# Patient Record
Sex: Female | Born: 1985 | Race: Black or African American | Hispanic: No | Marital: Single | Smoking: Former smoker
Health system: Southern US, Community
[De-identification: ages and names within clinical notes are randomized; demographics above are authoritative.]

## PROBLEM LIST (undated history)

## (undated) DIAGNOSIS — K5790 Diverticulosis of intestine, part unspecified, without perforation or abscess without bleeding: Secondary | ICD-10-CM

## (undated) DIAGNOSIS — K589 Irritable bowel syndrome without diarrhea: Secondary | ICD-10-CM

---

## 2009-12-24 HISTORY — PX: THYROIDECTOMY, PARTIAL: SHX18

## 2015-05-09 ENCOUNTER — Ambulatory Visit: Payer: Medicaid Other | Attending: Internal Medicine | Admitting: Internal Medicine

## 2015-05-09 ENCOUNTER — Encounter: Payer: Self-pay | Admitting: Internal Medicine

## 2015-05-09 VITALS — BP 107/72 | HR 86 | Temp 98.7°F | Ht 62.0 in | Wt 103.0 lb

## 2015-05-09 DIAGNOSIS — Z0000 Encounter for general adult medical examination without abnormal findings: Secondary | ICD-10-CM | POA: Diagnosis not present

## 2015-05-09 DIAGNOSIS — H538 Other visual disturbances: Secondary | ICD-10-CM

## 2015-05-09 DIAGNOSIS — G43909 Migraine, unspecified, not intractable, without status migrainosus: Secondary | ICD-10-CM | POA: Diagnosis not present

## 2015-05-09 MED ORDER — CYCLOBENZAPRINE HCL 5 MG PO TABS: 5.0000 mg | ORAL_TABLET | Freq: Three times a day (TID) | ORAL | Status: DC | PRN

## 2015-05-09 NOTE — Patient Instructions (Addendum)
I have sent referrals to Gynecology and Optometry (eye exam)  Please keep a log of all headaches s

## 2015-05-09 NOTE — Progress Notes (Signed)
Patient ID: Wendy Greene Dust, female   DOB: 1986-07-31, 29 y.o.   MRN: [id number] SUBJECTIVE: Wendy Greene Rando is a 29 y.o. female who complains of headaches for 1 month(s). Description of pain: squeezing pain in frontal region. Duration of individual headaches: few hour(s), frequency weekly, couple of times per week. Associated symptoms: dizziness, nausea and vomiting. Pain relief: acetaminophen, OTC NSAID's, lying in a darkened room and sleep. Precipitating factors: patient is aware of none. She denies a history of recent head injury.  Prior neurological history: negative for no neurological problems. Neurologic Review of Systems - no TIA or stroke-like symptoms.   OBJECTIVE: Appearance: alert, well appearing, and in no distress, oriented to person, place, and time and normal appearing weight. Neurological Exam: alert, oriented, normal speech, no focal findings or movement disorder noted, neck supple without rigidity, cranial nerves II through XII intact, Romberg sign negative, normal gait and station.   Judeth CornfieldStephanie was seen today for establish care.  Diagnoses and all orders for this visit:  Migraine without status migrainosus, not intractable, unspecified migraine type Orders: -    Begin cyclobenzaprine (FLEXERIL) 5 MG tablet; Take 1 tablet (5 mg total) by mouth 3 (three) times daily as needed. Headaches Patient will complete a headache diary for next visit. She has not been taking anything for pain and reports that the headaches are not severe. Will try flexeril first and have patient come back for a f/u  Blurred vision Orders: -     Ambulatory referral to Optometry  Preventative health care Orders: -     Ambulatory referral to Gynecology   PLAN: Recommendations: lie in darkened room and apply cold packs prn for pain, side effect profile discussed in detail, asked to keep headache diary and patient reassured that neurodiagnostic workup not indicated from benign H & P. See orders for  this visit as documented in the electronic medical record.  Return if symptoms worsen or fail to improve.  Holland CommonsKECK, Alitzel Cookson, NP 05/23/2015 12:15 PM

## 2015-05-09 NOTE — Progress Notes (Signed)
Patient is here to establish care She is complaining of headaches associated with nausea and vomiting She also complains of blurry vision separate from the headaches She complains of trouble seeing while driving at night She is due for a pap

## 2015-05-10 ENCOUNTER — Encounter: Payer: Self-pay | Admitting: Obstetrics & Gynecology

## 2015-05-23 ENCOUNTER — Encounter: Payer: Self-pay | Admitting: Internal Medicine

## 2015-06-01 ENCOUNTER — Other Ambulatory Visit (HOSPITAL_COMMUNITY)
Admission: RE | Admit: 2015-06-01 | Discharge: 2015-06-01 | Disposition: A | Discharge status: Home / Self Care | Payer: Medicaid Other | Source: Ambulatory Visit | Attending: Obstetrics & Gynecology | Admitting: Obstetrics & Gynecology

## 2015-06-01 ENCOUNTER — Encounter: Payer: Self-pay | Admitting: Obstetrics & Gynecology

## 2015-06-01 ENCOUNTER — Ambulatory Visit (INDEPENDENT_AMBULATORY_CARE_PROVIDER_SITE_OTHER): Payer: Medicaid Other | Admitting: Obstetrics & Gynecology

## 2015-06-01 VITALS — BP 115/72 | HR 90 | Temp 98.6°F | Ht 62.0 in | Wt 105.9 lb

## 2015-06-01 DIAGNOSIS — N649 Disorder of breast, unspecified: Secondary | ICD-10-CM | POA: Diagnosis not present

## 2015-06-01 DIAGNOSIS — Z01419 Encounter for gynecological examination (general) (routine) without abnormal findings: Secondary | ICD-10-CM

## 2015-06-01 DIAGNOSIS — Z124 Encounter for screening for malignant neoplasm of cervix: Secondary | ICD-10-CM

## 2015-06-01 DIAGNOSIS — Z1151 Encounter for screening for human papillomavirus (HPV): Secondary | ICD-10-CM | POA: Diagnosis not present

## 2015-06-01 NOTE — Patient Instructions (Addendum)
Thank you for enrolling in [facility]. Please follow the instructions below to securely access your online medical record. MyChart allows you to send messages to your doctor, view your test results, manage appointments, and more.   How Do I Sign Up? 1. In your Internet browser, go to [employer] and enter [email or web address]. 2. Click on the Sign Up Now link in the Sign In box. You will see the New Member Sign Up page. 3. Enter your MyChart Access Code exactly as it appears below. You will not need to use this code after you've completed the sign-up process. If you do not sign up before the expiration date, you must request a new code.  MyChart Access Code: [email or web address] Expires: 07/08/2015  2:49 PM  4. Enter your Social Security Number ([email or web address]) and Date of Birth (mm/dd/yyyy) as indicated and click Submit. You will be taken to the next sign-up page. 5. Create a MyChart ID. This will be your MyChart login ID and cannot be changed, so think of one that is secure and easy to remember. 6. Create a MyChart password. You can change your password at any time. 7. Enter your Password Reset Question and Answer. This can be used at a later time if you forget your password.  8. Enter your e-mail address. You will receive e-mail notification when new information is available in [place]. 9. Click Sign Up. You can now view your medical record.   Additional Information Remember, MyChart is NOT to be used for urgent needs. For medical emergencies, dial 911.     Preventive Care for Adults A healthy lifestyle and preventive care can promote health and wellness. Preventive health guidelines for women include the following key practices.  A routine yearly physical is a good way to check with your health care provider about your health and preventive screening. It is a chance to share any concerns and updates on your health and to receive a thorough exam.  Visit your dentist for a routine  exam and preventive care every 6 months. Brush your teeth twice a day and floss once a day. Good oral hygiene prevents tooth decay and gum disease.  The frequency of eye exams is based on your age, health, family medical history, use of contact lenses, and other factors. Follow your health care provider's recommendations for frequency of eye exams.  Eat a healthy diet. Foods like vegetables, fruits, whole grains, low-fat dairy products, and lean protein foods contain the nutrients you need without too many calories. Decrease your intake of foods high in solid fats, added sugars, and salt. Eat the right amount of calories for you.Get information about a proper diet from your health care provider, if necessary.  Regular physical exercise is one of the most important things you can do for your health. Most adults should get at least 150 minutes of moderate-intensity exercise (any activity that increases your heart rate and causes you to sweat) each week. In addition, most adults need muscle-strengthening exercises on 2 or more days a week.  Maintain a healthy weight. The body mass index (BMI) is a screening tool to identify possible weight problems. It provides an estimate of body fat based on height and weight. Your health care provider can find your BMI and can help you achieve or maintain a healthy weight.For adults 20 years and older:  A BMI below 18.5 is considered underweight.  A BMI of 18.5 to 24.9 is normal.  A BMI of 25 to 29.9 is considered overweight.  A BMI of 30 and above is considered obese.  Maintain normal blood lipids and cholesterol levels by exercising and minimizing your intake of saturated fat. Eat a balanced diet with plenty of fruit and vegetables. Blood tests for lipids and cholesterol should begin at age 40 and be repeated every 5 years. If your lipid or cholesterol levels are high, you are over 50, or you are at high risk for heart disease, you may need your cholesterol  levels checked more frequently.Ongoing high lipid and cholesterol levels should be treated with medicines if diet and exercise are not working.  If you smoke, find out from your health care provider how to quit. If you do not use tobacco, do not start.  Lung cancer screening is recommended for adults aged 85-80 years who are at high risk for developing lung cancer because of a history of smoking. A yearly low-dose CT scan of the lungs is recommended for people who have at least a 30-pack-year history of smoking and are a current smoker or have quit within the past 15 years. A pack year of smoking is smoking an average of 1 pack of cigarettes a day for 1 year (for example: 1 pack a day for 30 years or 2 packs a day for 15 years). Yearly screening should continue until the smoker has stopped smoking for at least 15 years. Yearly screening should be stopped for people who develop a health problem that would prevent them from having lung cancer treatment.  If you are pregnant, do not drink alcohol. If you are breastfeeding, be very cautious about drinking alcohol. If you are not pregnant and choose to drink alcohol, do not have more than 1 drink per day. One drink is considered to be 12 ounces (355 mL) of beer, 5 ounces (148 mL) of wine, or 1.5 ounces (44 mL) of liquor.  Avoid use of street drugs. Do not share needles with anyone. Ask for help if you need support or instructions about stopping the use of drugs.  High blood pressure causes heart disease and increases the risk of stroke. Your blood pressure should be checked at least every 1 to 2 years. Ongoing high blood pressure should be treated with medicines if weight loss and exercise do not work.  If you are 59-15 years old, ask your health care provider if you should take aspirin to prevent strokes.  Diabetes screening involves taking a blood sample to check your fasting blood sugar level. This should be done once every 3 years, after age 107, if you  are within normal weight and without risk factors for diabetes. Testing should be considered at a younger age or be carried out more frequently if you are overweight and have at least 1 risk factor for diabetes.  Breast cancer screening is essential preventive care for women. You should practice "breast self-awareness." This means understanding the normal appearance and feel of your breasts and may include breast self-examination. Any changes detected, no matter how small, should be reported to a health care provider. Women in their 57s and 30s should have a clinical breast exam (CBE) by a health care provider as part of a regular health exam every 1 to 3 years. After age 98, women should have a CBE every year. Starting at age 107, women should consider having a mammogram (breast X-ray test) every year. Women who have a family history of breast cancer should talk to their health care provider about genetic screening. Women at a high risk  of breast cancer should talk to their health care providers about having an MRI and a mammogram every year.  Breast cancer gene (BRCA)-related cancer risk assessment is recommended for women who have family members with BRCA-related cancers. BRCA-related cancers include breast, ovarian, tubal, and peritoneal cancers. Having family members with these cancers may be associated with an increased risk for harmful changes (mutations) in the breast cancer genes BRCA1 and BRCA2. Results of the assessment will determine the need for genetic counseling and BRCA1 and BRCA2 testing.  Routine pelvic exams to screen for cancer are no longer recommended for nonpregnant women who are considered low risk for cancer of the pelvic organs (ovaries, uterus, and vagina) and who do not have symptoms. Ask your health care provider if a screening pelvic exam is right for you.  If you have had past treatment for cervical cancer or a condition that could lead to cancer, you need Pap tests and  screening for cancer for at least 20 years after your treatment. If Pap tests have been discontinued, your risk factors (such as having a new sexual partner) need to be reassessed to determine if screening should be resumed. Some women have medical problems that increase the chance of getting cervical cancer. In these cases, your health care provider may recommend more frequent screening and Pap tests.  The HPV test is an additional test that may be used for cervical cancer screening. The HPV test looks for the virus that can cause the cell changes on the cervix. The cells collected during the Pap test can be tested for HPV. The HPV test could be used to screen women aged 44 years and older, and should be used in women of any age who have unclear Pap test results. After the age of 58, women should have HPV testing at the same frequency as a Pap test.  Colorectal cancer can be detected and often prevented. Most routine colorectal cancer screening begins at the age of 94 years and continues through age 64 years. However, your health care provider may recommend screening at an earlier age if you have risk factors for colon cancer. On a yearly basis, your health care provider may provide home test kits to check for hidden blood in the stool. Use of a small camera at the end of a tube, to directly examine the colon (sigmoidoscopy or colonoscopy), can detect the earliest forms of colorectal cancer. Talk to your health care provider about this at age 74, when routine screening begins. Direct exam of the colon should be repeated every 5-10 years through age 26 years, unless early forms of pre-cancerous polyps or small growths are found.  People who are at an increased risk for hepatitis B should be screened for this virus. You are considered at high risk for hepatitis B if:  You were born in a country where hepatitis B occurs often. Talk with your health care provider about which countries are considered high  risk.  Your parents were born in a high-risk country and you have not received a shot to protect against hepatitis B (hepatitis B vaccine).  You have HIV or AIDS.  You use needles to inject street drugs.  You live with, or have sex with, someone who has hepatitis B.  You get hemodialysis treatment.  You take certain medicines for conditions like cancer, organ transplantation, and autoimmune conditions.  Hepatitis C blood testing is recommended for all people born from [birth year] through 1967 and any individual with known risks for  hepatitis C.  Practice safe sex. Use condoms and avoid high-risk sexual practices to reduce the spread of sexually transmitted infections (STIs). STIs include gonorrhea, chlamydia, syphilis, trichomonas, herpes, HPV, and human immunodeficiency virus (HIV). Herpes, HIV, and HPV are viral illnesses that have no cure. They can result in disability, cancer, and death.  You should be screened for sexually transmitted illnesses (STIs) including gonorrhea and chlamydia if:  You are sexually active and are younger than 24 years.  You are older than 24 years and your health care provider tells you that you are at risk for this type of infection.  Your sexual activity has changed since you were last screened and you are at an increased risk for chlamydia or gonorrhea. Ask your health care provider if you are at risk.  If you are at risk of being infected with HIV, it is recommended that you take a prescription medicine daily to prevent HIV infection. This is called preexposure prophylaxis (PrEP). You are considered at risk if:  You are a heterosexual woman, are sexually active, and are at increased risk for HIV infection.  You take drugs by injection.  You are sexually active with a partner who has HIV.  Talk with your health care provider about whether you are at high risk of being infected with HIV. If you choose to begin PrEP, you should first be tested for HIV. You  should then be tested every 3 months for as long as you are taking PrEP.  Osteoporosis is a disease in which the bones lose minerals and strength with aging. This can result in serious bone fractures or breaks. The risk of osteoporosis can be identified using a bone density scan. Women ages 4 years and over and women at risk for fractures or osteoporosis should discuss screening with their health care providers. Ask your health care provider whether you should take a calcium supplement or vitamin D to reduce the rate of osteoporosis.  Menopause can be associated with physical symptoms and risks. Hormone replacement therapy is available to decrease symptoms and risks. You should talk to your health care provider about whether hormone replacement therapy is right for you.  Use sunscreen. Apply sunscreen liberally and repeatedly throughout the day. You should seek shade when your shadow is shorter than you. Protect yourself by wearing long sleeves, pants, a wide-brimmed hat, and sunglasses year round, whenever you are outdoors.  Once a month, do a whole body skin exam, using a mirror to look at the skin on your back. Tell your health care provider of new moles, moles that have irregular borders, moles that are larger than a pencil eraser, or moles that have changed in shape or color.  Stay current with required vaccines (immunizations).  Influenza vaccine. All adults should be immunized every year.  Tetanus, diphtheria, and acellular pertussis (Td, Tdap) vaccine. Pregnant women should receive 1 dose of Tdap vaccine during each pregnancy. The dose should be obtained regardless of the length of time since the last dose. Immunization is preferred during the 27th-36th week of gestation. An adult who has not previously received Tdap or who does not know her vaccine status should receive 1 dose of Tdap. This initial dose should be followed by tetanus and diphtheria toxoids (Td) booster doses every 10 years.  Adults with an unknown or incomplete history of completing a 3-dose immunization series with Td-containing vaccines should begin or complete a primary immunization series including a Tdap dose. Adults should receive a Td booster every  10 years.  Varicella vaccine. An adult without evidence of immunity to varicella should receive 2 doses or a second dose if she has previously received 1 dose. Pregnant females who do not have evidence of immunity should receive the first dose after pregnancy. This first dose should be obtained before leaving the health care facility. The second dose should be obtained 4-8 weeks after the first dose.  Human papillomavirus (HPV) vaccine. Females aged 13-26 years who have not received the vaccine previously should obtain the 3-dose series. The vaccine is not recommended for use in pregnant females. However, pregnancy testing is not needed before receiving a dose. If a female is found to be pregnant after receiving a dose, no treatment is needed. In that case, the remaining doses should be delayed until after the pregnancy. Immunization is recommended for any person with an immunocompromised condition through the age of 30 years if she did not get any or all doses earlier. During the 3-dose series, the second dose should be obtained 4-8 weeks after the first dose. The third dose should be obtained 24 weeks after the first dose and 16 weeks after the second dose.  Zoster vaccine. One dose is recommended for adults aged 59 years or older unless certain conditions are present.  Measles, mumps, and rubella (MMR) vaccine. Adults born before [birth year] generally are considered immune to measles and mumps. Adults born in [birth year] or later should have 1 or more doses of MMR vaccine unless there is a contraindication to the vaccine or there is laboratory evidence of immunity to each of the three diseases. A routine second dose of MMR vaccine should be obtained at least 28 days after the first dose  for students attending postsecondary schools, health care workers, or international travelers. People who received inactivated measles vaccine or an unknown type of measles vaccine during 1963-1967 should receive 2 doses of MMR vaccine. People who received inactivated mumps vaccine or an unknown type of mumps vaccine before 1979 and are at high risk for mumps infection should consider immunization with 2 doses of MMR vaccine. For females of childbearing age, rubella immunity should be determined. If there is no evidence of immunity, females who are not pregnant should be vaccinated. If there is no evidence of immunity, females who are pregnant should delay immunization until after pregnancy. Unvaccinated health care workers born before [birth year] who lack laboratory evidence of measles, mumps, or rubella immunity or laboratory confirmation of disease should consider measles and mumps immunization with 2 doses of MMR vaccine or rubella immunization with 1 dose of MMR vaccine.  Pneumococcal 13-valent conjugate (PCV13) vaccine. When indicated, a person who is uncertain of her immunization history and has no record of immunization should receive the PCV13 vaccine. An adult aged 34 years or older who has certain medical conditions and has not been previously immunized should receive 1 dose of PCV13 vaccine. This PCV13 should be followed with a dose of pneumococcal polysaccharide (PPSV23) vaccine. The PPSV23 vaccine dose should be obtained at least 8 weeks after the dose of PCV13 vaccine. An adult aged 54 years or older who has certain medical conditions and previously received 1 or more doses of PPSV23 vaccine should receive 1 dose of PCV13. The PCV13 vaccine dose should be obtained 1 or more years after the last PPSV23 vaccine dose.  Pneumococcal polysaccharide (PPSV23) vaccine. When PCV13 is also indicated, PCV13 should be obtained first. All adults aged 74 years and older should be immunized. An adult younger than age  65 years who has certain medical conditions should be immunized. Any person who resides in a nursing home or long-term care facility should be immunized. An adult smoker should be immunized. People with an immunocompromised condition and certain other conditions should receive both PCV13 and PPSV23 vaccines. People with human immunodeficiency virus (HIV) infection should be immunized as soon as possible after diagnosis. Immunization during chemotherapy or radiation therapy should be avoided. Routine use of PPSV23 vaccine is not recommended for American Indians, [place] Natives, or people younger than 65 years unless there are medical conditions that require PPSV23 vaccine. When indicated, people who have unknown immunization and have no record of immunization should receive PPSV23 vaccine. One-time revaccination 5 years after the first dose of PPSV23 is recommended for people aged 19-64 years who have chronic kidney failure, nephrotic syndrome, asplenia, or immunocompromised conditions. People who received 1-2 doses of PPSV23 before age 2 years should receive another dose of PPSV23 vaccine at age 63 years or later if at least 5 years have passed since the previous dose. Doses of PPSV23 are not needed for people immunized with PPSV23 at or after age 65 years.  Meningococcal vaccine. Adults with asplenia or persistent complement component deficiencies should receive 2 doses of quadrivalent meningococcal conjugate (MenACWY-D) vaccine. The doses should be obtained at least 2 months apart. Microbiologists working with certain meningococcal bacteria, [place] recruits, people at risk during an outbreak, and people who travel to or live in countries with a high rate of meningitis should be immunized. A first-year college student up through age 35 years who is living in a residence hall should receive a dose if she did not receive a dose on or after her 16th birthday. Adults who have certain high-risk conditions should  receive one or more doses of vaccine.  Hepatitis A vaccine. Adults who wish to be protected from this disease, have certain high-risk conditions, work with hepatitis A-infected animals, work in hepatitis A research labs, or travel to or work in countries with a high rate of hepatitis A should be immunized. Adults who were previously unvaccinated and who anticipate close contact with an international adoptee during the first 60 days after arrival in the [place] from a country with a high rate of hepatitis A should be immunized.  Hepatitis B vaccine. Adults who wish to be protected from this disease, have certain high-risk conditions, may be exposed to blood or other infectious body fluids, are household contacts or sex partners of hepatitis B positive people, are clients or workers in certain care facilities, or travel to or work in countries with a high rate of hepatitis B should be immunized.  Haemophilus influenzae type b (Hib) vaccine. A previously unvaccinated person with asplenia or sickle cell disease or having a scheduled splenectomy should receive 1 dose of Hib vaccine. Regardless of previous immunization, a recipient of a hematopoietic stem cell transplant should receive a 3-dose series 6-12 months after her successful transplant. Hib vaccine is not recommended for adults with HIV infection. Preventive Services / Frequency Ages 70 to 8 years  Blood pressure check.** / Every 1 to 2 years.  Lipid and cholesterol check.** / Every 5 years beginning at age 1.  Clinical breast exam.** / Every 3 years for women in their 71s and 11s.  BRCA-related cancer risk assessment.** / For women who have family members with a BRCA-related cancer (breast, ovarian, tubal, or peritoneal cancers).  Pap test.** / Every 2 years from ages 48 through 88. Every 3 years  starting at age 64 through age 3 or 46 with a history of 3 consecutive normal Pap tests.  HPV screening.** / Every 3 years from ages 50  through ages 79 to 25 with a history of 3 consecutive normal Pap tests.  Hepatitis C blood test.** / For any individual with known risks for hepatitis C.  Skin self-exam. / Monthly.  Influenza vaccine. / Every year.  Tetanus, diphtheria, and acellular pertussis (Tdap, Td) vaccine.** / Consult your health care provider. Pregnant women should receive 1 dose of Tdap vaccine during each pregnancy. 1 dose of Td every 10 years.  Varicella vaccine.** / Consult your health care provider. Pregnant females who do not have evidence of immunity should receive the first dose after pregnancy.  HPV vaccine. / 3 doses over 6 months, if 63 and younger. The vaccine is not recommended for use in pregnant females. However, pregnancy testing is not needed before receiving a dose.  Measles, mumps, rubella (MMR) vaccine.** / You need at least 1 dose of MMR if you were born in 1957 or later. You may also need a 2nd dose. For females of childbearing age, rubella immunity should be determined. If there is no evidence of immunity, females who are not pregnant should be vaccinated. If there is no evidence of immunity, females who are pregnant should delay immunization until after pregnancy.  Pneumococcal 13-valent conjugate (PCV13) vaccine.** / Consult your health care provider.  Pneumococcal polysaccharide (PPSV23) vaccine.** / 1 to 2 doses if you smoke cigarettes or if you have certain conditions.  Meningococcal vaccine.** / 1 dose if you are age 78 to 62 years and a Market researcher living in a residence hall, or have one of several medical conditions, you need to get vaccinated against meningococcal disease. You may also need additional booster doses.  Hepatitis A vaccine.** / Consult your health care provider.  Hepatitis B vaccine.** / Consult your health care provider.  Haemophilus influenzae type b (Hib) vaccine.** / Consult your health care provider. Ages 75 to 8 years  Blood pressure check.** /  Every 1 to 2 years.  Lipid and cholesterol check.** / Every 5 years beginning at age 18 years.  Lung cancer screening. / Every year if you are aged 34-80 years and have a 30-pack-year history of smoking and currently smoke or have quit within the past 15 years. Yearly screening is stopped once you have quit smoking for at least 15 years or develop a health problem that would prevent you from having lung cancer treatment.  Clinical breast exam.** / Every year after age 76 years.  BRCA-related cancer risk assessment.** / For women who have family members with a BRCA-related cancer (breast, ovarian, tubal, or peritoneal cancers).  Mammogram.** / Every year beginning at age 15 years and continuing for as long as you are in good health. Consult with your health care provider.  Pap test.** / Every 3 years starting at age 16 years through age 34 or 28 years with a history of 3 consecutive normal Pap tests.  HPV screening.** / Every 3 years from ages 31 years through ages 74 to 46 years with a history of 3 consecutive normal Pap tests.  Fecal occult blood test (FOBT) of stool. / Every year beginning at age 66 years and continuing until age 77 years. You may not need to do this test if you get a colonoscopy every 10 years.  Flexible sigmoidoscopy or colonoscopy.** / Every 5 years for a flexible sigmoidoscopy or every 10  years for a colonoscopy beginning at age 68 years and continuing until age 45 years.  Hepatitis C blood test.** / For all people born from [birth year] through 1967 and any individual with known risks for hepatitis C.  Skin self-exam. / Monthly.  Influenza vaccine. / Every year.  Tetanus, diphtheria, and acellular pertussis (Tdap/Td) vaccine.** / Consult your health care provider. Pregnant women should receive 1 dose of Tdap vaccine during each pregnancy. 1 dose of Td every 10 years.  Varicella vaccine.** / Consult your health care provider. Pregnant females who do not have evidence of  immunity should receive the first dose after pregnancy.  Zoster vaccine.** / 1 dose for adults aged 48 years or older.  Measles, mumps, rubella (MMR) vaccine.** / You need at least 1 dose of MMR if you were born in 1957 or later. You may also need a 2nd dose. For females of childbearing age, rubella immunity should be determined. If there is no evidence of immunity, females who are not pregnant should be vaccinated. If there is no evidence of immunity, females who are pregnant should delay immunization until after pregnancy.  Pneumococcal 13-valent conjugate (PCV13) vaccine.** / Consult your health care provider.  Pneumococcal polysaccharide (PPSV23) vaccine.** / 1 to 2 doses if you smoke cigarettes or if you have certain conditions.  Meningococcal vaccine.** / Consult your health care provider.  Hepatitis A vaccine.** / Consult your health care provider.  Hepatitis B vaccine.** / Consult your health care provider.  Haemophilus influenzae type b (Hib) vaccine.** / Consult your health care provider. Ages 69 years and over  Blood pressure check.** / Every 1 to 2 years.  Lipid and cholesterol check.** / Every 5 years beginning at age 24 years.  Lung cancer screening. / Every year if you are aged 69-80 years and have a 30-pack-year history of smoking and currently smoke or have quit within the past 15 years. Yearly screening is stopped once you have quit smoking for at least 15 years or develop a health problem that would prevent you from having lung cancer treatment.  Clinical breast exam.** / Every year after age 60 years.  BRCA-related cancer risk assessment.** / For women who have family members with a BRCA-related cancer (breast, ovarian, tubal, or peritoneal cancers).  Mammogram.** / Every year beginning at age 78 years and continuing for as long as you are in good health. Consult with your health care provider.  Pap test.** / Every 3 years starting at age 82 years through age 39 or  32 years with 3 consecutive normal Pap tests. Testing can be stopped between 65 and 70 years with 3 consecutive normal Pap tests and no abnormal Pap or HPV tests in the past 10 years.  HPV screening.** / Every 3 years from ages 75 years through ages 64 or 44 years with a history of 3 consecutive normal Pap tests. Testing can be stopped between 65 and 70 years with 3 consecutive normal Pap tests and no abnormal Pap or HPV tests in the past 10 years.  Fecal occult blood test (FOBT) of stool. / Every year beginning at age 26 years and continuing until age 74 years. You may not need to do this test if you get a colonoscopy every 10 years.  Flexible sigmoidoscopy or colonoscopy.** / Every 5 years for a flexible sigmoidoscopy or every 10 years for a colonoscopy beginning at age 29 years and continuing until age 51 years.  Hepatitis C blood test.** / For all people born from  1945 through 1965 and any individual with known risks for hepatitis C.  Osteoporosis screening.** / A one-time screening for women ages 75 years and over and women at risk for fractures or osteoporosis.  Skin self-exam. / Monthly.  Influenza vaccine. / Every year.  Tetanus, diphtheria, and acellular pertussis (Tdap/Td) vaccine.** / 1 dose of Td every 10 years.  Varicella vaccine.** / Consult your health care provider.  Zoster vaccine.** / 1 dose for adults aged 49 years or older.  Pneumococcal 13-valent conjugate (PCV13) vaccine.** / Consult your health care provider.  Pneumococcal polysaccharide (PPSV23) vaccine.** / 1 dose for all adults aged 48 years and older.  Meningococcal vaccine.** / Consult your health care provider.  Hepatitis A vaccine.** / Consult your health care provider.  Hepatitis B vaccine.** / Consult your health care provider.  Haemophilus influenzae type b (Hib) vaccine.** / Consult your health care provider. ** Family history and personal history of risk and conditions may change your health care  provider's recommendations. Document Released: 02/05/2002 Document Revised: 04/26/2014 Document Reviewed: 05/07/2011 [facility] Patient Information 2015 [place], [place]. This information is not intended to replace advice given to you by your health care provider. Make sure you discuss any questions you have with your health care provider.

## 2015-06-01 NOTE — Progress Notes (Signed)
GYNECOLOGY CLINIC ANNUAL PREVENTATIVE CARE ENCOUNTER NOTE  Subjective:   Wendy Greene is a 29 y.o. [phone] female here for a routine annual gynecologic exam.  Current complaints: none.   Denies abnormal vaginal bleeding, discharge, problems with intercourse or other gynecologic concerns.    Gynecologic History Patient's last menstrual period was 05/15/2015. Contraception: none; has same-sex sexual partner Last Pap: 2014. Results were: normal  Obstetric History OB History  Gravida Para Term Preterm AB SAB TAB Ectopic Multiple Living  2 2 0 2 0 0 0 0 0 2     # Outcome Date GA Lbr Len/2nd Weight Sex Delivery Anes PTL Lv  2 Preterm 11/13/05 3120w0d   Stoney BangM Vag-Spont  Y Y  1 Preterm 11/13/04 8131w0d   F Vag-Spont  Y Y      History reviewed. No pertinent past medical history.  Past Surgical History  Procedure Laterality Date  . Thyroidectomy, partial  2011    Current Outpatient Prescriptions on File Prior to Visit  Medication Sig Dispense Refill  . cyclobenzaprine (FLEXERIL) 5 MG tablet Take 1 tablet (5 mg total) by mouth 3 (three) times daily as needed. Headaches 30 tablet 1   No current facility-administered medications on file prior to visit.    No Known Allergies  History   Social History  . Marital Status: Single    Spouse Name: N/A  . Number of Children: N/A  . Years of Education: N/A   Occupational History  . Not on file.   Social History Main Topics  . Smoking status: Former Smoker    Quit date: 03/09/2015  . Smokeless tobacco: Not on file  . Alcohol Use: Not on file  . Drug Use: No  . Sexual Activity: Not on file   Other Topics Concern  . Not on file   Social History Narrative    Family History  Problem Relation Age of Onset  . Hypertension Mother   . Hypertension Father     The following portions of the patient's history were reviewed and updated as appropriate: allergies, current medications, past family history, past medical history, past  social history, past surgical history and problem list.  Review of Systems Pertinent items are noted in HPI.   Objective:   BP 115/72 mmHg  Pulse 90  Temp(Src) 98.6 F (37 C)  Ht 5\' 2"  (1.575 m)  Wt 105 lb 14.4 oz (48.036 kg)  BMI 19.36 kg/m2  LMP 05/15/2015 CONSTITUTIONAL: Well-developed, well-nourished female in no acute distress.  HENT:  Normocephalic, atraumatic, External right and left ear normal. Oropharynx is clear and moist EYES: Conjunctivae and EOM are normal. Pupils are equal, round, and reactive to light. No scleral icterus.  NECK: Normal range of motion, supple, no masses.  Normal thyroid.  SKIN: Skin is warm and dry. No rash noted. Not diaphoretic. No erythema. No pallor. NEUROLGIC: Alert and oriented to person, place, and time. Normal reflexes, muscle tone coordination. No cranial nerve deficit noted. PSYCHIATRIC: Normal mood and affect. Normal behavior. Normal judgment and thought content. CARDIOVASCULAR: Normal heart rate noted, regular rhythm RESPIRATORY: Clear to auscultation bilaterally. Effort and breath sounds normal, no problems with respiration noted. BREASTS: Symmetric in size. 7 mm smooth, round, mobile lesion palpated about 1 cm underneath the nipple (6 o'clock).  Some tenderness to palpation over this lesion.  No other masses, skin changes, nipple drainage, or lymphadenopathy bilaterally. ABDOMEN: Soft, normal bowel sounds, no distention noted.  No tenderness, rebound or guarding.  PELVIC: Normal appearing external genitalia;  normal appearing vaginal mucosa and cervix.  No abnormal discharge noted.  Pap smear obtained.  Normal uterine size, no other palpable masses, no uterine or adnexal tenderness. MUSCULOSKELETAL: Normal range of motion. No tenderness.  No cyanosis, clubbing, or edema.  2+ distal pulses.   Assessment:   Annual gynecologic examination with pap smear Left breast lesion   Plan:   Will follow up results of pap smear and manage  accordingly. Left breast lesion is most likely benign, but imaging ordered for further evaluation.  Will follow up results and manage accordingly. Routine preventative health maintenance measures emphasized. Please refer to After Visit Summary for other counseling recommendations.    Jaynie Collins, MD, FACOG Attending Obstetrician & Gynecologist Center for [employer], [facility]

## 2015-06-02 LAB — CYTOLOGY - PAP

## 2015-06-10 ENCOUNTER — Ambulatory Visit
Admission: RE | Admit: 2015-06-10 | Discharge: 2015-06-10 | Disposition: A | Discharge status: Home / Self Care | Payer: Medicaid Other | Source: Ambulatory Visit | Attending: Obstetrics & Gynecology | Admitting: Obstetrics & Gynecology

## 2015-06-10 DIAGNOSIS — N649 Disorder of breast, unspecified: Secondary | ICD-10-CM

## 2015-06-21 ENCOUNTER — Other Ambulatory Visit: Payer: Self-pay | Admitting: Internal Medicine

## 2015-07-08 ENCOUNTER — Encounter: Payer: Self-pay | Admitting: Internal Medicine

## 2016-06-04 ENCOUNTER — Ambulatory Visit: Payer: Medicaid Other | Attending: Family Medicine | Admitting: Family Medicine

## 2016-06-04 ENCOUNTER — Other Ambulatory Visit (HOSPITAL_COMMUNITY)
Admission: RE | Admit: 2016-06-04 | Discharge: 2016-06-04 | Disposition: A | Discharge status: Home / Self Care | Payer: Medicaid Other | Source: Ambulatory Visit | Attending: Family Medicine | Admitting: Family Medicine

## 2016-06-04 ENCOUNTER — Encounter: Payer: Self-pay | Admitting: Family Medicine

## 2016-06-04 VITALS — BP 109/65 | HR 78 | Temp 98.1°F | Ht 62.0 in | Wt 120.0 lb

## 2016-06-04 DIAGNOSIS — Z1151 Encounter for screening for human papillomavirus (HPV): Secondary | ICD-10-CM | POA: Diagnosis present

## 2016-06-04 DIAGNOSIS — Z01419 Encounter for gynecological examination (general) (routine) without abnormal findings: Secondary | ICD-10-CM | POA: Insufficient documentation

## 2016-06-04 DIAGNOSIS — N760 Acute vaginitis: Secondary | ICD-10-CM | POA: Insufficient documentation

## 2016-06-04 DIAGNOSIS — Z13228 Encounter for screening for other metabolic disorders: Secondary | ICD-10-CM | POA: Diagnosis not present

## 2016-06-04 DIAGNOSIS — Z113 Encounter for screening for infections with a predominantly sexual mode of transmission: Secondary | ICD-10-CM | POA: Diagnosis present

## 2016-06-04 DIAGNOSIS — Z124 Encounter for screening for malignant neoplasm of cervix: Secondary | ICD-10-CM | POA: Diagnosis not present

## 2016-06-04 DIAGNOSIS — Z0000 Encounter for general adult medical examination without abnormal findings: Secondary | ICD-10-CM

## 2016-06-04 NOTE — Progress Notes (Signed)
   Subjective:  Patient ID: Wendy Greene, female    DOB: 10/28/1986  Age: 30 y.o. MRN: [id number]  CC: PAP and lip rash   HPI Wendy MoltStephanie Darnold presents for a complete physical exam. She previously had a rash in her upper lip which she said is resolving at this time.   Outpatient Prescriptions Prior to Visit  Medication Sig Dispense Refill  . cyclobenzaprine (FLEXERIL) 5 MG tablet Take 1 tablet (5 mg total) by mouth 3 (three) times daily as needed. Headaches 30 tablet 1   No facility-administered medications prior to visit.    ROS Review of Systems  Constitutional: Negative for activity change, appetite change and fatigue.  HENT: Negative for congestion, sinus pressure and sore throat.   Eyes: Negative for visual disturbance.  Respiratory: Negative for cough, chest tightness, shortness of breath and wheezing.   Cardiovascular: Negative for chest pain and palpitations.  Gastrointestinal: Negative for abdominal pain, constipation and abdominal distention.  Endocrine: Negative for polydipsia.  Genitourinary: Negative for dysuria and frequency.  Musculoskeletal: Negative for back pain and arthralgias.  Skin: Negative for rash.  Neurological: Negative for tremors, light-headedness and numbness.  Hematological: Does not bruise/bleed easily.  Psychiatric/Behavioral: Negative for behavioral problems and agitation.    Objective:  BP 109/65 mmHg  Pulse 78  Temp(Src) 98.1 F (36.7 C)  Ht 5\' 2"  (1.575 m)  Wt 120 lb (54.432 kg)  BMI 21.94 kg/m2  BP/Weight 06/04/2016 06/01/2015 05/09/2015  Systolic BP 109 115 107  Diastolic BP 65 72 72  Wt. (Lbs) 120 105.9 103  BMI 21.94 19.36 18.83      Physical Exam  Constitutional: She is oriented to person, place, and time. She appears well-developed and well-nourished. No distress.  HENT:  Head: Normocephalic.  Right Ear: External ear normal.  Left Ear: External ear normal.  Nose: Nose normal.  Mouth/Throat: Oropharynx is clear and  moist.  Eyes: Conjunctivae and EOM are normal. Pupils are equal, round, and reactive to light.  Neck: Normal range of motion. No JVD present.  Cardiovascular: Normal rate, regular rhythm, normal heart sounds and intact distal pulses.  Exam reveals no gallop.   No murmur heard. Pulmonary/Chest: Effort normal and breath sounds normal. No respiratory distress. She has no wheezes. She has no rales. She exhibits no tenderness.  Abdominal: Soft. Bowel sounds are normal. She exhibits no distension and no mass. There is no tenderness.  Genitourinary: Vagina normal and uterus normal.  Breast: Normal appearance, no tenderness, no palpable lumps Cervix: Normal, no cervical motion tenderness  Musculoskeletal: Normal range of motion. She exhibits no edema or tenderness.  Neurological: She is alert and oriented to person, place, and time. She has normal reflexes.  Skin: Skin is warm and dry. She is not diaphoretic.  Psychiatric: She has a normal mood and affect.     Assessment & Plan:   1. Screening for cervical cancer - Cytology - PAP [place]  2. Screening for metabolic disorder - COMPLETE METABOLIC PANEL WITH GFR; Future - Lipid panel; Future  3. Annual physical exam - HIV antibody (with reflex); Future - Cervicovaginal ancillary only -[place] in [place].  No orders of the defined types were placed in this encounter.    Follow-up: Return in about 1 year (around 06/04/2017) for Complete physical exam.   Jaclyn ShaggyEnobong Amao MD

## 2016-06-06 LAB — CYTOLOGY - PAP

## 2016-06-06 LAB — CERVICOVAGINAL ANCILLARY ONLY: WET PREP (BD AFFIRM): POSITIVE — AB

## 2016-06-07 ENCOUNTER — Other Ambulatory Visit: Payer: Self-pay | Admitting: Family Medicine

## 2016-06-07 MED ORDER — METRONIDAZOLE 0.75 % VA GEL: 1.0000 | Freq: Every day | VAGINAL | Status: DC

## 2016-06-07 MED FILL — VANDAZOLE VAGINAL 0.75% GEL: 0.75 | 5 days supply | Qty: 70 | Fill #0

## 2016-06-14 ENCOUNTER — Ambulatory Visit: Payer: Medicaid Other | Attending: Family Medicine

## 2016-06-14 DIAGNOSIS — Z13228 Encounter for screening for other metabolic disorders: Secondary | ICD-10-CM | POA: Insufficient documentation

## 2016-06-14 LAB — COMPLETE METABOLIC PANEL WITH GFR
ALBUMIN: 4.2 g/dL (ref 3.6–5.1)
ALT: 11 U/L (ref 6–29)
AST: 19 U/L (ref 10–30)
Alkaline Phosphatase: 65 U/L (ref 33–115)
BUN: 13 mg/dL (ref 7–25)
CALCIUM: 9.2 mg/dL (ref 8.6–10.2)
CO2: 25 mmol/L (ref 20–31)
CREATININE: 0.87 mg/dL (ref 0.50–1.10)
Chloride: 103 mmol/L (ref 98–110)
GFR, Est African American: >89 mL/min (ref >=60)
GFR, Est Non African American: 89 mL/min (ref >=60)
GLUCOSE: 81 mg/dL (ref 65–99)
Potassium: 4.1 mmol/L (ref 3.5–5.3)
SODIUM: 137 mmol/L (ref 135–146)
Total Bilirubin: 0.6 mg/dL (ref 0.2–1.2)
Total Protein: 7.5 g/dL (ref 6.1–8.1)

## 2016-06-14 NOTE — Patient Instructions (Signed)
Patient is aware of receiving a FU call regarding results. 

## 2016-08-12 ENCOUNTER — Encounter (HOSPITAL_COMMUNITY): Payer: Self-pay

## 2016-08-12 ENCOUNTER — Emergency Department (HOSPITAL_COMMUNITY)
Admission: EM | Admit: 2016-08-12 | Discharge: 2016-08-12 | Disposition: A | Discharge status: Home / Self Care | Payer: Medicaid Other | Attending: Emergency Medicine | Admitting: Emergency Medicine

## 2016-08-12 DIAGNOSIS — R21 Rash and other nonspecific skin eruption: Secondary | ICD-10-CM | POA: Diagnosis present

## 2016-08-12 DIAGNOSIS — Z87891 Personal history of nicotine dependence: Secondary | ICD-10-CM | POA: Diagnosis not present

## 2016-08-12 DIAGNOSIS — L03113 Cellulitis of right upper limb: Secondary | ICD-10-CM | POA: Insufficient documentation

## 2016-08-12 MED ORDER — CEPHALEXIN 500 MG PO CAPS: 500.0000 mg | ORAL_CAPSULE | Freq: Four times a day (QID) | ORAL | 0 refills | Status: AC

## 2016-08-12 MED ORDER — CEPHALEXIN 250 MG PO CAPS
500.0000 mg | ORAL_CAPSULE | Freq: Once | ORAL | Status: AC
  Administered 2016-08-12: 500 mg via ORAL
  Filled 2016-08-12: qty 2

## 2016-08-12 MED ORDER — DIPHENHYDRAMINE HCL 25 MG PO CAPS
25.0000 mg | ORAL_CAPSULE | Freq: Once | ORAL | Status: AC
  Administered 2016-08-12: 25 mg via ORAL
  Filled 2016-08-12: qty 1

## 2016-08-12 MED ORDER — DIPHENHYDRAMINE HCL 25 MG PO TABS: 25.0000 mg | ORAL_TABLET | Freq: Four times a day (QID) | ORAL | 0 refills | Status: DC

## 2016-08-12 NOTE — ED Provider Notes (Signed)
[facility]-EMERGENCY DEPT Provider Note   CSN: [id number] Arrival date & time: 08/12/16  1129  By signing my name below, I, Christy SartoriusAnastasia Kolousek, attest that this documentation has been prepared under the direction and in the presence of  Danelle BerryLeisa Julionna Marczak, PA-C. Electronically Signed: Christy SartoriusAnastasia Kolousek, ED Scribe. 08/12/16. 12:44 PM.   History   Chief Complaint Chief Complaint  Patient presents with  . Rash   The history is provided by the patient. No language interpreter was used.    HPI Comments:  Wendy MoltStephanie Biever is a 30 y.o. female who presents to the Emergency Department complaining of a rash on her right forearm that appeared yesterday.  It is red and swollen and has enlarged this morning and it painful to the touch, without itching.   She denies bug bites, breaks in her skin and h/o skin infection.  She also denies fever. She has a splint on her left wrist for carpel tunnel, but has no rash near her splint.  She applied ice to her arm last night, but has not applied any new topical medicines.  The rash is no where else on her body.  She has not had any swelling of her face or lips, no respiratory complaints including no wheeze or shortness of breath. No sensation of throat closure, she has not taken any medication prior to arrival.  History reviewed. No pertinent past medical history.  There are no active problems to display for this patient.   Past Surgical History:  Procedure Laterality Date  . THYROIDECTOMY, PARTIAL  2011    OB History    Gravida Para Term Preterm AB Living   2 2 0 2 0 2   SAB TAB Ectopic Multiple Live Births   0 0 0 0 2       Home Medications    Prior to Admission medications   Medication Sig Start Date End Date Taking? Authorizing Provider  cephALEXin (KEFLEX) 500 MG capsule Take 1 capsule (500 mg total) by mouth 4 (four) times daily. 08/12/16 08/17/16  Danelle BerryLeisa Waleed Dettman, PA-C  diphenhydrAMINE (BENADRYL) 25 MG tablet Take 1 tablet (25 mg total) by mouth every 6  (six) hours. 08/12/16   Danelle BerryLeisa Jameire Kouba, PA-C  metroNIDAZOLE (METROGEL VAGINAL) 0.75 % vaginal gel Place 1 Applicatorful vaginally at bedtime. 06/07/16   Jaclyn ShaggyEnobong Amao, MD    Family History Family History  Problem Relation Age of Onset  . Hypertension Mother   . Hypertension Father     Social History Social History  Substance Use Topics  . Smoking status: Former Smoker    Quit date: 03/09/2015  . Smokeless tobacco: Never Used  . Alcohol use Not on file     Allergies   Review of patient's allergies indicates no known allergies.   Review of Systems Review of Systems  All other systems reviewed and are negative.    Physical Exam Updated Vital Signs BP 129/79 (BP Location: Left Arm)   Pulse 105   Temp 99 F (37.2 C) (Oral)   Resp 20   LMP 07/31/2016   SpO2 98%   Physical Exam  Constitutional: She appears well-developed and well-nourished. No distress.  HENT:  Head: Normocephalic and atraumatic.  Nose: Nose normal.  Mouth/Throat: Oropharynx is clear and moist.  Eyes: Conjunctivae are normal. Pupils are equal, round, and reactive to light. Right eye exhibits no discharge. Left eye exhibits no discharge. No scleral icterus.  Neck: Normal range of motion.  Cardiovascular: Normal rate.   Pulmonary/Chest: Effort normal. No stridor. No  respiratory distress. She has no wheezes.  Abdominal: She exhibits no distension.  Musculoskeletal: Normal range of motion.  Right wrist in velcro splint, right proximal forearm with dorsal erythema tender to palpation. Erythema and a macular rash Distribution, no induration, no fluctuance, no drainage, no excoriations Right elbow normal  Neurological: She is alert. She exhibits normal muscle tone. Coordination normal.  Skin: Skin is warm and dry. Capillary refill takes less than 2 seconds. Rash noted. She is not diaphoretic. There is erythema. No pallor.  Psychiatric: She has a normal mood and affect. Her behavior is normal.  Nursing note and  vitals reviewed.      ED Treatments / Results   DIAGNOSTIC STUDIES:  Oxygen Saturation is 98% on RA, nml by my interpretation.    COORDINATION OF CARE:  12:44 PM Will give benadryl and keflex.  Discussed treatment plan with pt at bedside and pt agreed to plan.   Labs (all labs ordered are listed, but only abnormal results are displayed) Labs Reviewed - No data to display  EKG  EKG Interpretation None       Radiology No results found.  Procedures Procedures (including critical care time)  Medications Ordered in ED Medications  diphenhydrAMINE (BENADRYL) capsule 25 mg (not administered)  cephALEXin (KEFLEX) capsule 500 mg (not administered)     Initial Impression / Assessment and Plan / ED Course  I have reviewed the triage vital signs and the nursing notes.  Pertinent labs & imaging results that were available during my care of the patient were reviewed by me and considered in my medical decision making (see chart for details).  Clinical Course  Right forearm redness, swelling and pain, non-pruritic, rash suspicious local hives/whelps versus cellulitis. Will give Benadryl and Keflex coverage. No streaking redness. No respiratory complaints, rash to face or facial or lip swelling. Normal range of motion of elbow, right wrist in a splint for carpel tunnel, did not evaluate range of motion. Rash ends proximal to the splint. Follow up with PCP in 2-3 days.  She states she has a difficult time getting appointment and she was encouraged to return to the ER as needed for recheck, return precautions reviewed, patient verbalizes understanding.   Final Clinical Impressions(s) / ED Diagnoses   Final diagnoses:  Cellulitis of right upper extremity    New Prescriptions New Prescriptions   CEPHALEXIN (KEFLEX) 500 MG CAPSULE    Take 1 capsule (500 mg total) by mouth 4 (four) times daily.   DIPHENHYDRAMINE (BENADRYL) 25 MG TABLET    Take 1 tablet (25 mg total) by mouth  every 6 (six) hours.   I personally performed the services described in this documentation, which was scribed in my presence. The recorded information has been reviewed and is accurate.        Danelle BerryLeisa Mahsa Hanser, PA-C 08/12/16 1306    Alvira MondayErin Schlossman, MD 08/16/16 1331

## 2016-08-12 NOTE — ED Triage Notes (Signed)
Patient here with itchy/painful rash to right arm x 1 day, no drainage, no injury, NAD

## 2016-08-12 NOTE — ED Notes (Signed)
Declined W/C at D/C and was escorted to lobby by RN. 

## 2016-08-23 ENCOUNTER — Ambulatory Visit: Payer: Medicaid Other | Admitting: Family Medicine

## 2017-08-16 ENCOUNTER — Emergency Department (HOSPITAL_COMMUNITY)
Admission: EM | Admit: 2017-08-16 | Discharge: 2017-08-16 | Discharge status: Against Medical Advice | Payer: Medicaid Other | Attending: Emergency Medicine | Admitting: Emergency Medicine

## 2017-08-16 ENCOUNTER — Emergency Department (HOSPITAL_COMMUNITY): Payer: Medicaid Other

## 2017-08-16 ENCOUNTER — Encounter (HOSPITAL_COMMUNITY): Payer: Self-pay | Admitting: *Deleted

## 2017-08-16 DIAGNOSIS — G8929 Other chronic pain: Secondary | ICD-10-CM | POA: Insufficient documentation

## 2017-08-16 DIAGNOSIS — M545 Low back pain: Secondary | ICD-10-CM | POA: Insufficient documentation

## 2017-08-16 DIAGNOSIS — R079 Chest pain, unspecified: Secondary | ICD-10-CM | POA: Insufficient documentation

## 2017-08-16 DIAGNOSIS — Z5321 Procedure and treatment not carried out due to patient leaving prior to being seen by health care provider: Secondary | ICD-10-CM | POA: Insufficient documentation

## 2017-08-16 LAB — CBC
HCT: 41.5 % (ref 36.0–46.0)
Hemoglobin: 13.4 g/dL (ref 12.0–15.0)
MCH: 27.4 pg (ref 26.0–34.0)
MCHC: 32.3 g/dL (ref 30.0–36.0)
MCV: 84.9 fL (ref 78.0–100.0)
Platelets: 251 10*3/uL (ref 150–400)
RBC: 4.89 MIL/uL (ref 3.87–5.11)
RDW: 13 % (ref 11.5–15.5)
WBC: 9.2 10*3/uL (ref 4.0–10.5)

## 2017-08-16 LAB — I-STAT TROPONIN, ED: Troponin i, poc: 0 ng/mL (ref 0.00–0.08)

## 2017-08-16 LAB — BASIC METABOLIC PANEL
Anion gap: 8 (ref 5–15)
BUN: 11 mg/dL (ref 6–20)
CALCIUM: 9.3 mg/dL (ref 8.9–10.3)
CO2: 25 mmol/L (ref 22–32)
Chloride: 105 mmol/L (ref 101–111)
Creatinine, Ser: 0.82 mg/dL (ref 0.44–1.00)
GFR calc Af Amer: >60 mL/min (ref >60)
GLUCOSE: 85 mg/dL (ref 65–99)
Potassium: 3.7 mmol/L (ref 3.5–5.1)
SODIUM: 138 mmol/L (ref 135–145)

## 2017-08-16 NOTE — ED Notes (Signed)
Patient told NT that she was leaving.  Will d/c.

## 2017-08-16 NOTE — ED Triage Notes (Signed)
PT is here with chronic lower back pain and now with intermittent chest pains for the last 2-3 days

## 2017-09-09 ENCOUNTER — Ambulatory Visit: Payer: Medicaid Other | Admitting: Family Medicine

## 2017-11-13 ENCOUNTER — Encounter: Payer: Self-pay | Admitting: Family Medicine

## 2017-11-13 ENCOUNTER — Ambulatory Visit: Payer: 59 | Attending: Family Medicine | Admitting: Family Medicine

## 2017-11-13 VITALS — BP 106/66 | HR 75 | Temp 97.4°F | Wt 131.6 lb

## 2017-11-13 DIAGNOSIS — Z9889 Other specified postprocedural states: Secondary | ICD-10-CM | POA: Insufficient documentation

## 2017-11-13 DIAGNOSIS — M545 Low back pain, unspecified: Secondary | ICD-10-CM

## 2017-11-13 DIAGNOSIS — R51 Headache: Secondary | ICD-10-CM | POA: Diagnosis not present

## 2017-11-13 DIAGNOSIS — Z79899 Other long term (current) drug therapy: Secondary | ICD-10-CM | POA: Diagnosis not present

## 2017-11-13 DIAGNOSIS — R519 Headache, unspecified: Secondary | ICD-10-CM

## 2017-11-13 MED ORDER — TIZANIDINE HCL 4 MG PO TABS: 4.0000 mg | ORAL_TABLET | Freq: Three times a day (TID) | ORAL | 1 refills | Status: DC | PRN

## 2017-11-13 MED ORDER — CETIRIZINE HCL 10 MG PO TABS: 10.0000 mg | ORAL_TABLET | Freq: Every day | ORAL | 1 refills | Status: DC

## 2017-11-13 NOTE — Progress Notes (Signed)
Subjective:  Patient ID: Wendy Greene, female    DOB: June 09, 1986  Age: 31 y.o. MRN: [id number]  CC:   HPI Wendy Greene is a 31 year old female who was scheduled for a physical today but was unable to undergo a Pap smear due to commencing her menstruation today.  She complains of low back pain which does not radiate down to her lower extremity and has been present intermittently for the last few weeks. At her job she does a lot of walking and standing and uses ibuprofen 800 mg with some relief in the pain. Denies numbness in extremities or loss of sphincteric function.  She also complains of headaches which occur about once to twice a week but denies visual complaints, nausea or photophobia.  She denies a history of migraines  No past medical history on file.  Past Surgical History:  Procedure Laterality Date  . THYROIDECTOMY, PARTIAL  2011    No Known Allergies   Outpatient Medications Prior to Visit  Medication Sig Dispense Refill  . diphenhydrAMINE (BENADRYL) 25 MG tablet Take 1 tablet (25 mg total) by mouth every 6 (six) hours. (Patient not taking: Reported on 11/13/2017) 20 tablet 0  . metroNIDAZOLE (METROGEL VAGINAL) 0.75 % vaginal gel Place 1 Applicatorful vaginally at bedtime. (Patient not taking: Reported on 11/13/2017) 70 g 0   No facility-administered medications prior to visit.     ROS Review of Systems  Constitutional: Negative for activity change, appetite change and fatigue.  HENT: Negative for congestion, sinus pressure and sore throat.   Eyes: Negative for visual disturbance.  Respiratory: Negative for cough, chest tightness, shortness of breath and wheezing.   Cardiovascular: Negative for chest pain and palpitations.  Gastrointestinal: Negative for abdominal distention, abdominal pain and constipation.  Endocrine: Negative for polydipsia.  Genitourinary: Negative for dysuria and frequency.  Musculoskeletal: Positive for back pain. Negative for  arthralgias.  Skin: Negative for rash.  Neurological: Positive for headaches. Negative for tremors, light-headedness and numbness.  Hematological: Does not bruise/bleed easily.  Psychiatric/Behavioral: Negative for agitation and behavioral problems.    Objective:  BP 106/66   Pulse 75   Temp (!) 97.4 F (36.3 C) (Oral)   Wt 131 lb 9.6 oz (59.7 kg)   SpO2 98%   BMI 24.07 kg/m   BP/Weight 11/13/2017 08/16/2017 08/12/2016  Systolic BP 106 98 102  Diastolic BP 66 71 65  Wt. (Lbs) 131.6 - -  BMI 24.07 - -      Physical Exam  Constitutional: She is oriented to person, place, and time. She appears well-developed and well-nourished.  Cardiovascular: Normal rate, normal heart sounds and intact distal pulses.  No murmur heard. Pulmonary/Chest: Effort normal and breath sounds normal. She has no wheezes. She has no rales. She exhibits no tenderness.  Abdominal: Soft. Bowel sounds are normal. She exhibits no distension and no mass. There is no tenderness.  Musculoskeletal: Normal range of motion. She exhibits tenderness (slght tenderness on lumbar spine palpation; negative straight leg raise).  Neurological: She is alert and oriented to person, place, and time.     Assessment & Plan:   1. Acute midline low back pain without sciatica Likely due to prolonged standing and walking at work Apply heat - tiZANidine (ZANAFLEX) 4 MG tablet; Take 1 tablet (4 mg total) by mouth every 8 (eight) hours as needed for muscle spasms.  Dispense: 90 tablet; Refill: 1  2. Sinus headache Patient to use OTC ibuprofen-600 mg every 12 hours as needed. We will commence  antihistamine - cetirizine (ZYRTEC) 10 MG tablet; Take 1 tablet (10 mg total) by mouth daily.  Dispense: 30 tablet; Refill: 1   Meds ordered this encounter  Medications  . cetirizine (ZYRTEC) 10 MG tablet    Sig: Take 1 tablet (10 mg total) by mouth daily.    Dispense:  30 tablet    Refill:  1  . tiZANidine (ZANAFLEX) 4 MG tablet     Sig: Take 1 tablet (4 mg total) by mouth every 8 (eight) hours as needed for muscle spasms.    Dispense:  90 tablet    Refill:  1    Follow-up: Return in about 3 weeks (around 12/04/2017) for complete physical exam and PAP smear.   Jaclyn ShaggyEnobong Amao MD

## 2017-12-12 ENCOUNTER — Encounter: Payer: Self-pay | Admitting: Family Medicine

## 2017-12-12 ENCOUNTER — Other Ambulatory Visit (HOSPITAL_COMMUNITY)
Admission: RE | Admit: 2017-12-12 | Discharge: 2017-12-12 | Disposition: A | Discharge status: Home / Self Care | Payer: 59 | Source: Ambulatory Visit | Attending: Family Medicine | Admitting: Family Medicine

## 2017-12-12 ENCOUNTER — Ambulatory Visit: Payer: 59 | Attending: Family Medicine | Admitting: Family Medicine

## 2017-12-12 VITALS — BP 96/60 | HR 79 | Temp 98.3°F | Ht 63.0 in | Wt 130.8 lb

## 2017-12-12 DIAGNOSIS — Z13228 Encounter for screening for other metabolic disorders: Secondary | ICD-10-CM

## 2017-12-12 DIAGNOSIS — Z0000 Encounter for general adult medical examination without abnormal findings: Secondary | ICD-10-CM | POA: Diagnosis not present

## 2017-12-12 DIAGNOSIS — Z113 Encounter for screening for infections with a predominantly sexual mode of transmission: Secondary | ICD-10-CM | POA: Diagnosis not present

## 2017-12-12 DIAGNOSIS — Z124 Encounter for screening for malignant neoplasm of cervix: Secondary | ICD-10-CM | POA: Diagnosis not present

## 2017-12-12 NOTE — Patient Instructions (Signed)

## 2017-12-12 NOTE — Progress Notes (Signed)
Subjective:  Patient ID: Wendy Greene, female    DOB: December 08, 1986  Age: 31 y.o. MRN: [id number]  CC: Annual Exam and Gynecologic Exam   HPI Wendy Greene presents for a complete physical exam  History reviewed. No pertinent past medical history.  Past Surgical History:  Procedure Laterality Date  . THYROIDECTOMY, PARTIAL  2011    No Known Allergies   Outpatient Medications Prior to Visit  Medication Sig Dispense Refill  . cetirizine (ZYRTEC) 10 MG tablet Take 1 tablet (10 mg total) by mouth daily. 30 tablet 1  . tiZANidine (ZANAFLEX) 4 MG tablet Take 1 tablet (4 mg total) by mouth every 8 (eight) hours as needed for muscle spasms. 90 tablet 1  . diphenhydrAMINE (BENADRYL) 25 MG tablet Take 1 tablet (25 mg total) by mouth every 6 (six) hours. (Patient not taking: Reported on 11/13/2017) 20 tablet 0  . metroNIDAZOLE (METROGEL VAGINAL) 0.75 % vaginal gel Place 1 Applicatorful vaginally at bedtime. (Patient not taking: Reported on 11/13/2017) 70 g 0   No facility-administered medications prior to visit.     ROS Review of Systems  Constitutional: Negative for activity change, appetite change and fatigue.  HENT: Negative for congestion, sinus pressure and sore throat.   Eyes: Negative for visual disturbance.  Respiratory: Negative for cough, chest tightness, shortness of breath and wheezing.   Cardiovascular: Negative for chest pain and palpitations.  Gastrointestinal: Negative for abdominal distention, abdominal pain and constipation.  Endocrine: Negative for polydipsia.  Genitourinary: Negative for dysuria and frequency.  Musculoskeletal: Negative for arthralgias and back pain.  Skin: Negative for rash.  Neurological: Negative for tremors, light-headedness and numbness.  Hematological: Does not bruise/bleed easily.  Psychiatric/Behavioral: Negative for agitation and behavioral problems.    Objective:  BP 96/60   Pulse 79   Temp 98.3 F (36.8 C) (Oral)   Ht 5'  3" (1.6 m)   Wt 130 lb 12.8 oz (59.3 kg)   LMP 12/07/2017   SpO2 99%   BMI 23.17 kg/m   BP/Weight 12/12/2017 11/13/2017 4/69/6295  Systolic BP 96 284 98  Diastolic BP 60 66 71  Wt. (Lbs) 130.8 131.6 -  BMI 23.17 24.07 -      Physical Exam  Constitutional: She is oriented to person, place, and time. She appears well-developed and well-nourished. No distress.  HENT:  Head: Normocephalic.  Right Ear: External ear normal.  Left Ear: External ear normal.  Nose: Nose normal.  Mouth/Throat: Oropharynx is clear and moist.  Eyes: Conjunctivae and EOM are normal. Pupils are equal, round, and reactive to light.  Neck: Normal range of motion. No JVD present.  Cardiovascular: Normal rate, regular rhythm, normal heart sounds and intact distal pulses. Exam reveals no gallop.  No murmur heard. Pulmonary/Chest: Effort normal and breath sounds normal. No respiratory distress. She has no wheezes. She has no rales. She exhibits no tenderness. Right breast exhibits no mass and no tenderness. Left breast exhibits no mass and no tenderness.  Abdominal: Soft. Bowel sounds are normal. She exhibits no distension and no mass. There is no tenderness.  Genitourinary:  Genitourinary Comments: External genitalia, vagina, cervix, adnexa-all normal  Musculoskeletal: Normal range of motion. She exhibits no edema or tenderness.  Neurological: She is alert and oriented to person, place, and time. She has normal reflexes.  Skin: Skin is warm and dry. She is not diaphoretic.  Psychiatric: She has a normal mood and affect.     Assessment & Plan:   1. Annual physical exam Counseled  on 150 minutes of exercise every week, healthy eating, routine healthcare maintenance. - CBC with Differential/Platelet  2. Screening for cervical cancer - Cytology - PAP [place]  3. Screening for STD (sexually transmitted disease) - HIV antibody (with reflex) - RPR  4. Screening for metabolic disorder -  BIP77+PZPS   No orders of the defined types were placed in this encounter.   Follow-up: Return in about 1 year (around 12/12/2018) for Complete Physical exam.   Arnoldo Morale MD

## 2017-12-13 LAB — CMP14+EGFR
ALK PHOS: 75 IU/L (ref 39–117)
ALT: 15 IU/L (ref 0–32)
AST: 22 IU/L (ref 0–40)
Albumin/Globulin Ratio: 1.4 (ref 1.2–2.2)
Albumin: 4.6 g/dL (ref 3.5–5.5)
BUN/Creatinine Ratio: 13 (ref 9–23)
BUN: 11 mg/dL (ref 6–20)
Bilirubin Total: 0.4 mg/dL (ref 0.0–1.2)
CO2: 22 mmol/L (ref 20–29)
Calcium: 9.4 mg/dL (ref 8.7–10.2)
Chloride: 102 mmol/L (ref 96–106)
Creatinine, Ser: 0.85 mg/dL (ref 0.57–1.00)
GFR calc Af Amer: 105 mL/min/{1.73_m2} (ref >59)
GFR calc non Af Amer: 91 mL/min/{1.73_m2} (ref >59)
GLOBULIN, TOTAL: 3.3 g/dL (ref 1.5–4.5)
GLUCOSE: 72 mg/dL (ref 65–99)
Potassium: 3.6 mmol/L (ref 3.5–5.2)
Sodium: 140 mmol/L (ref 134–144)
Total Protein: 7.9 g/dL (ref 6.0–8.5)

## 2017-12-13 LAB — CBC WITH DIFFERENTIAL/PLATELET
BASOS ABS: 0 10*3/uL (ref 0.0–0.2)
Basos: 0 %
EOS (ABSOLUTE): 0.2 10*3/uL (ref 0.0–0.4)
EOS: 3 %
Hematocrit: 42.6 % (ref 34.0–46.6)
Hemoglobin: 14.1 g/dL (ref 11.1–15.9)
IMMATURE GRANULOCYTES: 0 %
Immature Grans (Abs): 0 10*3/uL (ref 0.0–0.1)
Lymphocytes Absolute: 1.8 10*3/uL (ref 0.7–3.1)
Lymphs: 27 %
MCH: 28.1 pg (ref 26.6–33.0)
MCHC: 33.1 g/dL (ref 31.5–35.7)
MCV: 85 fL (ref 79–97)
MONOS ABS: 0.4 10*3/uL (ref 0.1–0.9)
Monocytes: 6 %
NEUTROS PCT: 64 %
Neutrophils Absolute: 4.2 10*3/uL (ref 1.4–7.0)
PLATELETS: 251 10*3/uL (ref 150–379)
RBC: 5.02 x10E6/uL (ref 3.77–5.28)
RDW: 13.2 % (ref 12.3–15.4)
WBC: 6.7 10*3/uL (ref 3.4–10.8)

## 2017-12-13 LAB — RPR: RPR Ser Ql: NONREACTIVE

## 2017-12-13 LAB — HIV ANTIBODY (ROUTINE TESTING W REFLEX): HIV SCREEN 4TH GENERATION: NONREACTIVE

## 2017-12-18 LAB — CYTOLOGY - PAP
CANDIDA VAGINITIS: NEGATIVE
Chlamydia: NEGATIVE
Diagnosis: NEGATIVE
HPV (WINDOPATH): NOT DETECTED
Neisseria Gonorrhea: NEGATIVE
TRICH (WINDOWPATH): NEGATIVE

## 2017-12-19 LAB — CERVICOVAGINAL ANCILLARY ONLY: HERPES (WINDOWPATH): NEGATIVE

## 2018-03-26 ENCOUNTER — Encounter: Payer: Self-pay | Admitting: Family Medicine

## 2018-05-29 ENCOUNTER — Ambulatory Visit: Payer: 59

## 2018-06-05 ENCOUNTER — Ambulatory Visit: Payer: Managed Care, Other (non HMO) | Attending: Family Medicine | Admitting: Physician Assistant

## 2018-06-05 ENCOUNTER — Other Ambulatory Visit (HOSPITAL_COMMUNITY)
Admission: RE | Admit: 2018-06-05 | Discharge: 2018-06-05 | Disposition: A | Discharge status: Home / Self Care | Payer: Managed Care, Other (non HMO) | Source: Ambulatory Visit | Attending: Family Medicine | Admitting: Family Medicine

## 2018-06-05 VITALS — BP 111/66 | HR 89 | Temp 99.0°F | Resp 18 | Ht 67.0 in

## 2018-06-05 DIAGNOSIS — Z79899 Other long term (current) drug therapy: Secondary | ICD-10-CM | POA: Insufficient documentation

## 2018-06-05 DIAGNOSIS — Z202 Contact with and (suspected) exposure to infections with a predominantly sexual mode of transmission: Secondary | ICD-10-CM | POA: Insufficient documentation

## 2018-06-05 DIAGNOSIS — L710 Perioral dermatitis: Secondary | ICD-10-CM | POA: Insufficient documentation

## 2018-06-05 LAB — POCT URINALYSIS DIPSTICK
Bilirubin, UA: NEGATIVE
Glucose, UA: NEGATIVE
LEUKOCYTES UA: NEGATIVE
NITRITE UA: NEGATIVE
PROTEIN UA: POSITIVE — AB
RBC UA: NEGATIVE
Spec Grav, UA: 1.02 (ref 1.010–1.025)
UROBILINOGEN UA: 1 U/dL
pH, UA: 6.5 (ref 5.0–8.0)

## 2018-06-05 NOTE — Progress Notes (Signed)
   Wendy MoltStephanie Greene, is a 32 y.o. female  [id number]  [id number]  DOB - 03/18/1986  Subjective:  Chief Complaint and HPI: Wendy Greene is a 32 y.o. female here for STD testing.  Has SSP and is worried there is infidelity.  Denies s/sx.    Also feels as though the area just outside her lips is becoming lighter colored.  Also, at times it iches and is sun-sensitive.    ROS:   Constitutional:  No f/c, No night sweats, No unexplained weight loss. EENT:  No vision changes, No blurry vision, No hearing changes. No mouth, throat, or ear problems.  Respiratory: No cough, No SOB Cardiac: No CP, no palpitations GI:  No abd pain, No N/V/D. GU: No Urinary s/sx Musculoskeletal: No joint pain Neuro: No headache, no dizziness, no motor weakness.  Skin: Skin around lips causing problem Endocrine:  No polydipsia. No polyuria.  Psych: Denies SI/HI  No problems updated.  ALLERGIES: No Known Allergies  PAST MEDICAL HISTORY: History reviewed. No pertinent past medical history.  MEDICATIONS AT HOME: Prior to Admission medications   Medication Sig Start Date End Date Taking? Authorizing Provider  cetirizine (ZYRTEC) 10 MG tablet Take 1 tablet (10 mg total) by mouth daily. 11/13/17  Yes Hoy RegisterNewlin, Enobong, MD  tiZANidine (ZANAFLEX) 4 MG tablet Take 1 tablet (4 mg total) by mouth every 8 (eight) hours as needed for muscle spasms. Patient not taking: Reported on 06/05/2018 11/13/17   Hoy RegisterNewlin, Enobong, MD     Objective:  EXAM:   Vitals:   06/05/18 1531  BP: 111/66  Pulse: 89  Resp: 18  Temp: 99 F (37.2 C)  TempSrc: Oral  SpO2: 98%  Height: 5\' 7"  (1.702 m)    General appearance : A&OX3. NAD. Non-toxic-appearing HEENT: Atraumatic and Normocephalic.  PERRLA. EOM intact.   Neck: supple, no JVD. No cervical lymphadenopathy. No thyromegaly Chest/Lungs:  Breathing-non-labored, Good air entry bilaterally, breath sounds normal without rales, rhonchi, or wheezing  CVS: S1 S2 regular,  no murmurs, gallops, rubs  Extremities: Bilateral Lower Ext shows no edema, both legs are warm to touch with = pulse throughout Neurology:  CN II-XII grossly intact, Non focal.   Psych:  TP linear. J/I WNL. Normal speech. Appropriate eye contact and affect.  Skin:  No Rash; lips appear normal without rash or abnormal pigment changes.   Data Review No results found for: HGBA1C   Assessment & Plan   1. STD exposure - Urinalysis Dipstick - Urine cytology ancillary only - HIV antibody (with reflex) - RPR  2. Perioral dermatitis I do not detect the problem she is seeing- - Ambulatory referral to Dermatology     Patient have been counseled extensively about nutrition and exercise  Return if symptoms worsen or fail to improve.  The patient was given clear instructions to go to ER or return to medical center if symptoms don't improve, worsen or new problems develop. The patient verbalized understanding. The patient was told to call to get lab results if they haven't heard anything in the next week.     Georgian CoAngela Ardean Melroy, PA-C [facility] [place], [place] [phone]   06/05/2018, 3:44 PM

## 2018-06-06 LAB — URINE CYTOLOGY ANCILLARY ONLY
Chlamydia: NEGATIVE
NEISSERIA GONORRHEA: NEGATIVE
TRICH (WINDOWPATH): NEGATIVE

## 2018-06-06 LAB — RPR: RPR Ser Ql: NONREACTIVE

## 2018-06-06 LAB — HIV ANTIBODY (ROUTINE TESTING W REFLEX): HIV SCREEN 4TH GENERATION: NONREACTIVE

## 2018-06-09 LAB — URINE CYTOLOGY ANCILLARY ONLY
Bacterial vaginitis: POSITIVE — AB
CANDIDA VAGINITIS: NEGATIVE

## 2018-06-11 ENCOUNTER — Other Ambulatory Visit: Payer: Self-pay | Admitting: Physician Assistant

## 2018-06-11 ENCOUNTER — Telehealth: Payer: Self-pay | Admitting: *Deleted

## 2018-06-11 MED ORDER — METRONIDAZOLE 500 MG PO TABS: 500.0000 mg | ORAL_TABLET | Freq: Two times a day (BID) | ORAL | 0 refills | Status: DC

## 2018-06-11 NOTE — Telephone Encounter (Signed)
Medical Assistant left message on patient's home and cell voicemail. Voicemail states to give a call back to Nubia with [facility] at [phone].  

## 2018-06-11 NOTE — Telephone Encounter (Signed)
-----   Message from Anders SimmondsAngela M McClung, [place] sent at 06/11/2018  9:05 AM EDT ----- Your STD testing was negative.  You do have bacterial vaginosis which is just overgrowth of bacteria in the vagina and NOT and STD.  I sent you a prescription to our pharmacy.  Thanks, Wendy CoAngela McClung, PA-C

## 2018-06-12 ENCOUNTER — Telehealth: Payer: Self-pay | Admitting: Family Medicine

## 2018-06-12 NOTE — Telephone Encounter (Signed)
Patient returned nurse call regarding lab results. Please f/u with patient.

## 2018-06-13 MED ORDER — METRONIDAZOLE 500 MG PO TABS: 500.0000 mg | ORAL_TABLET | Freq: Two times a day (BID) | ORAL | 0 refills | Status: DC

## 2018-06-13 NOTE — Telephone Encounter (Signed)
Patient verified DOB Patient is aware of BV being noted with no other STI's. Patient requested medication be sent to [facility]. No further questions.

## 2018-08-01 ENCOUNTER — Emergency Department (HOSPITAL_COMMUNITY)
Admission: EM | Admit: 2018-08-01 | Discharge: 2018-08-02 | Disposition: A | Discharge status: Home / Self Care | Payer: Managed Care, Other (non HMO) | Attending: Emergency Medicine | Admitting: Emergency Medicine

## 2018-08-01 ENCOUNTER — Other Ambulatory Visit: Payer: Self-pay

## 2018-08-01 ENCOUNTER — Encounter (HOSPITAL_COMMUNITY): Payer: Self-pay | Admitting: Emergency Medicine

## 2018-08-01 DIAGNOSIS — R6889 Other general symptoms and signs: Secondary | ICD-10-CM

## 2018-08-01 DIAGNOSIS — H579 Unspecified disorder of eye and adnexa: Secondary | ICD-10-CM | POA: Diagnosis present

## 2018-08-01 DIAGNOSIS — K1379 Other lesions of oral mucosa: Secondary | ICD-10-CM

## 2018-08-01 DIAGNOSIS — R519 Headache, unspecified: Secondary | ICD-10-CM

## 2018-08-01 DIAGNOSIS — R51 Headache: Secondary | ICD-10-CM | POA: Insufficient documentation

## 2018-08-01 DIAGNOSIS — Z87891 Personal history of nicotine dependence: Secondary | ICD-10-CM | POA: Insufficient documentation

## 2018-08-01 MED ORDER — CETIRIZINE HCL 10 MG PO TABS: 10.0000 mg | ORAL_TABLET | Freq: Every day | ORAL | 0 refills | Status: DC

## 2018-08-01 MED ORDER — NAPHAZOLINE-PHENIRAMINE 0.025-0.3 % OP SOLN: 1.0000 [drp] | OPHTHALMIC | 0 refills | Status: DC | PRN

## 2018-08-01 NOTE — ED Triage Notes (Signed)
Pt believes there is mold in her house b/c when she is in there she experiences itchy, watery eyes, lack of appetite, headaches, scratchy throat.

## 2018-08-01 NOTE — Discharge Instructions (Signed)
Take Zyrtec once daily for allergies Use Naphcon eye drops for itchy eyes Please follow up with your doctor Return if worsening

## 2018-08-02 NOTE — ED Notes (Signed)
Pt departed in NAD, refused use of wheelchair.  
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
## 2018-08-06 ENCOUNTER — Ambulatory Visit: Payer: Managed Care, Other (non HMO) | Attending: Family Medicine | Admitting: Physician Assistant

## 2018-08-06 VITALS — BP 103/66 | HR 84 | Temp 98.6°F | Resp 18 | Ht 63.0 in | Wt 133.0 lb

## 2018-08-06 DIAGNOSIS — R51 Headache: Secondary | ICD-10-CM | POA: Diagnosis present

## 2018-08-06 DIAGNOSIS — B349 Viral infection, unspecified: Secondary | ICD-10-CM | POA: Insufficient documentation
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
## 2018-08-07 ENCOUNTER — Telehealth: Payer: Self-pay | Admitting: *Deleted

## 2018-08-07 LAB — CBC WITH DIFFERENTIAL/PLATELET
BASOS ABS: 0.1 10*3/uL (ref 0.0–0.2)
Basos: 1 %
EOS (ABSOLUTE): 0.5 10*3/uL — AB (ref 0.0–0.4)
Eos: 5 %
HEMOGLOBIN: 14.4 g/dL (ref 11.1–15.9)
Hematocrit: 43.5 % (ref 34.0–46.6)
Immature Grans (Abs): 0 10*3/uL (ref 0.0–0.1)
Immature Granulocytes: 0 %
LYMPHS ABS: 1.9 10*3/uL (ref 0.7–3.1)
Lymphs: 20 %
MCH: 27.5 pg (ref 26.6–33.0)
MCHC: 33.1 g/dL (ref 31.5–35.7)
MCV: 83 fL (ref 79–97)
MONOCYTES: 9 %
MONOS ABS: 0.9 10*3/uL (ref 0.1–0.9)
NEUTROS ABS: 6.2 10*3/uL (ref 1.4–7.0)
Neutrophils: 65 %
Platelets: 260 10*3/uL (ref 150–450)
RBC: 5.23 x10E6/uL (ref 3.77–5.28)
RDW: 12.4 % (ref 12.3–15.4)
WBC: 9.5 10*3/uL (ref 3.4–10.8)

## 2018-08-07 NOTE — Telephone Encounter (Signed)
Medical Assistant left message on patient's home and cell voicemail. Voicemail states to give a call back to [place] [facility] at [phone]. !!!Please inform patient of blood count being normal and this being reassuring. Patient needs to drink 80-100 oz of water an get enough rest. Patient should follow up as planned!!!

## 2018-08-07 NOTE — Telephone Encounter (Signed)
-----   Message from Anders SimmondsAngela M McClung, [place] sent at 08/07/2018  7:59 AM EDT ----- Your blood count is normal which is reassuring.  Make sure you are drinking about 80 ounces of water daily and get ample rest.  Follow-up as planned.  Thanks, Georgian CoAngela McClung, PA-C

## 2019-01-07 ENCOUNTER — Other Ambulatory Visit: Payer: Managed Care, Other (non HMO) | Admitting: Family Medicine

## 2019-03-24 ENCOUNTER — Other Ambulatory Visit: Payer: Managed Care, Other (non HMO) | Admitting: Family Medicine

## 2019-06-12 ENCOUNTER — Other Ambulatory Visit: Payer: Self-pay

## 2019-06-12 DIAGNOSIS — Z20822 Contact with and (suspected) exposure to covid-19: Secondary | ICD-10-CM

## 2019-06-15 LAB — NOVEL CORONAVIRUS, NAA: SARS-CoV-2, NAA: NOT DETECTED

## 2019-06-29 NOTE — Progress Notes (Signed)
I spoke to the patient on the phone who stated she had obtained her results through my chart

## 2019-10-15 ENCOUNTER — Other Ambulatory Visit: Payer: Self-pay

## 2019-10-15 DIAGNOSIS — Z20822 Contact with and (suspected) exposure to covid-19: Secondary | ICD-10-CM

## 2019-10-16 LAB — NOVEL CORONAVIRUS, NAA: SARS-CoV-2, NAA: NOT DETECTED

## 2019-11-13 ENCOUNTER — Other Ambulatory Visit: Payer: Self-pay

## 2019-11-13 ENCOUNTER — Encounter (HOSPITAL_COMMUNITY): Payer: Self-pay

## 2019-11-13 ENCOUNTER — Ambulatory Visit (HOSPITAL_COMMUNITY)
Admission: EM | Admit: 2019-11-13 | Discharge: 2019-11-13 | Disposition: A | Discharge status: Home / Self Care | Payer: 59 | Attending: Emergency Medicine | Admitting: Emergency Medicine

## 2019-11-13 DIAGNOSIS — M25571 Pain in right ankle and joints of right foot: Secondary | ICD-10-CM | POA: Diagnosis not present

## 2019-11-13 MED ORDER — DICLOFENAC SODIUM 50 MG PO TBEC: 50.0000 mg | DELAYED_RELEASE_TABLET | Freq: Two times a day (BID) | ORAL | 0 refills | Status: DC

## 2019-11-13 NOTE — ED Triage Notes (Signed)
Pt presents to UC w/ c/o right foot pain under ankle since yesterday. Pt states she does not recall injuring it.

## 2019-11-13 NOTE — Discharge Instructions (Signed)
Ice, elevation, activity as tolerated to help with pain.  Diclofenac twice a day, take with food, to help with pain. May use tylenol for breakthrough pain. Don't take additional ibuprofen or aleve.  Follow up with either sports medicine or podiatry for persistent symptoms.  Wear shoes with good support.

## 2019-11-13 NOTE — ED Provider Notes (Signed)
[facility]    CSN: [id number] Arrival date & time: 11/13/19  1508      History   Chief Complaint Chief Complaint  Patient presents with  . rt foot pain    HPI Wendy Greene is a 33 y.o. female.   Wendy Greene presents with complaints of right ankle pain which started yesterday as mild and has increased. No known injury. She is on her feet as she works. Noted increased pain last night once home and took off her shoes. This morning when she got out of bed she noted increased pain as well. Has had some swelling. Applied ice last night. Did take tylenol and ibuprofen which have helped some. Wrapping an ace wrap also has helped. Denies any previous similar. No history of gout. Pain with prolonged standing. No numbness or tingling. No rash.    ROS per HPI, negative if not otherwise mentioned.      History reviewed. No pertinent past medical history.  There are no active problems to display for this patient.   Past Surgical History:  Procedure Laterality Date  . THYROIDECTOMY, PARTIAL  2011    OB History    Gravida  2   Para  2   Term  0   Preterm  2   AB  0   Living  2     SAB  0   TAB  0   Ectopic  0   Multiple  0   Live Births  2            Home Medications    Prior to Admission medications   Medication Sig Start Date End Date Taking? Authorizing Provider  cetirizine (ZYRTEC) 10 MG tablet Take 1 tablet (10 mg total) by mouth daily. 08/01/18   Bethel Born, PA-C  diclofenac (VOLTAREN) 50 MG EC tablet Take 1 tablet (50 mg total) by mouth 2 (two) times daily. 11/13/19   Georgetta Haber, NP  naphazoline-pheniramine (NAPHCON-A) 0.025-0.3 % ophthalmic solution Place 1 drop into both eyes every 4 (four) hours as needed for eye irritation. Patient not taking: Reported on 08/06/2018 08/01/18   Bethel Born, PA-C    Family History Family History  Problem Relation Age of Onset  . Hypertension Mother   . Hypertension Father      Social History Social History   Tobacco Use  . Smoking status: Former Smoker    Quit date: 03/09/2015    Years since quitting: 4.6  . Smokeless tobacco: Never Used  Substance Use Topics  . Alcohol use: Yes    Comment: soc  . Drug use: No     Allergies   Patient has no known allergies.   Review of Systems Review of Systems   Physical Exam Triage Vital Signs ED Triage Vitals  Enc Vitals Group     BP 11/13/19 1547 122/80     Pulse Rate 11/13/19 1547 90     Resp 11/13/19 1547 16     Temp 11/13/19 1547 98.9 F (37.2 C)     Temp Source 11/13/19 1547 Oral     SpO2 11/13/19 1547 99 %     Weight --      Height --      Head Circumference --      Peak Flow --      Pain Score 11/13/19 1550 5     Pain Loc --      Pain Edu? --      Excl. in  GC? --    No data found.  Updated Vital Signs BP 122/80 (BP Location: Right Arm)   Pulse 90   Temp 98.9 F (37.2 C) (Oral)   Resp 16   LMP 11/03/2019   SpO2 99%    Physical Exam Constitutional:      General: She is not in acute distress.    Appearance: She is well-developed.  Cardiovascular:     Rate and Rhythm: Normal rate.  Pulmonary:     Effort: Pulmonary effort is normal.  Musculoskeletal:     Right ankle: She exhibits swelling. She exhibits normal range of motion, no ecchymosis, no deformity and no laceration. Tenderness. Achilles tendon normal.     Comments: Soft tissues tenderness surrounding right lateral malleolus, without bony tenderness, no redness or warmth; mild tenderness to left anterior ankle soft tissues; foot without pain; strong pulse; no heel pain  Skin:    General: Skin is warm and dry.  Neurological:     Mental Status: She is alert and oriented to person, place, and time.      UC Treatments / Results  Labs (all labs ordered are listed, but only abnormal results are displayed) Labs Reviewed - No data to display  EKG   Radiology No results found.  Procedures Procedures (including  critical care time)  Medications Ordered in UC Medications - No data to display  Initial Impression / Assessment and Plan / UC Course  I have reviewed the triage vital signs and the nursing notes.  Pertinent labs & imaging results that were available during my care of the patient were reviewed by me and considered in my medical decision making (see chart for details).     No injury, no acute trauma, no bony tenderness, imaging deferred. Arthritis vs strain vs gout all considered and dicussed. Ace wrap placed. Pain management discussed. Sports med or podiatry follow up recommended as needed. Patient verbalized understanding and agreeable to plan.  Ambulatory out of clinic without difficulty.    Final Clinical Impressions(s) / UC Diagnoses   Final diagnoses:  Acute right ankle pain     Discharge Instructions     Ice, elevation, activity as tolerated to help with pain.  Diclofenac twice a day, take with food, to help with pain. May use tylenol for breakthrough pain. Don't take additional ibuprofen or aleve.  Follow up with either sports medicine or podiatry for persistent symptoms.  Wear shoes with good support.    ED Prescriptions    Medication Sig Dispense Auth. Provider   diclofenac (VOLTAREN) 50 MG EC tablet Take 1 tablet (50 mg total) by mouth 2 (two) times daily. 30 tablet Zigmund Gottron, NP     PDMP not reviewed this encounter.   Zigmund Gottron, NP 11/13/19 2214

## 2019-11-26 ENCOUNTER — Ambulatory Visit (INDEPENDENT_AMBULATORY_CARE_PROVIDER_SITE_OTHER): Payer: No Typology Code available for payment source

## 2019-11-26 ENCOUNTER — Other Ambulatory Visit: Payer: Self-pay

## 2019-11-26 ENCOUNTER — Encounter: Payer: Self-pay | Admitting: Podiatry

## 2019-11-26 ENCOUNTER — Other Ambulatory Visit: Payer: Self-pay | Admitting: Podiatry

## 2019-11-26 ENCOUNTER — Ambulatory Visit: Payer: Managed Care, Other (non HMO) | Admitting: Podiatry

## 2019-11-26 VITALS — BP 109/72 | HR 91 | Resp 16

## 2019-11-26 DIAGNOSIS — M779 Enthesopathy, unspecified: Secondary | ICD-10-CM | POA: Diagnosis not present

## 2019-11-26 DIAGNOSIS — M79671 Pain in right foot: Secondary | ICD-10-CM | POA: Diagnosis not present

## 2019-11-26 MED ORDER — DICLOFENAC SODIUM 75 MG PO TBEC: 75.0000 mg | DELAYED_RELEASE_TABLET | Freq: Two times a day (BID) | ORAL | 2 refills | Status: DC

## 2019-11-26 NOTE — Progress Notes (Signed)
Subjective:   Patient ID: Wendy Greene, female   DOB: 33 y.o.   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  Patient does not smoke likes to be active   Review of Systems  All other systems reviewed and are negative.       Objective:  Physical Exam Vitals signs and nursing note reviewed.  Constitutional:      Appearance: She is well-developed.  Pulmonary:     Effort: Pulmonary effort is normal.  Musculoskeletal: Normal range of motion.  Skin:    General: Skin is warm.  Neurological:     Mental Status: She is alert.     Neurovascular status intact muscle strength found to be adequate range of motion is within normal limits.  Patient is noted to have exquisite discomfort in the sinus tarsi right with inflammation fluid buildup and is also noted to have discomfort into the outside of the right foot around the peroneal tendon as it inserts into the base of the fifth metatarsal.  Patient is noted to have good digital perfusion well oriented x3 and is keeping her foot in a spasm position due to the pain and is developing muscle fatigue secondary to doing this     Assessment:  Probability for inflammatory-like condition with compensatory spasm of the muscle group not allowing for motion to occur     Plan:  H&P conditions reviewed and today I did go ahead and I injected the sinus tarsi right 3 mg Kenalog 5 mg Xylocaine and I applied air fracture walker to completely immobilize the foot along with utilization of diclofenac 75 mg twice daily.  I explained exercises to her the possibility I may send her to physical therapy if motion is not improved at next visit and I did encourage her to do this  X-ray indicates moderate depression of the arch but no other  indications of pathology or indications of stress fracture advanced arthritis or coalition

## 2019-11-26 NOTE — Progress Notes (Signed)
   Subjective:    Patient ID: Wendy Greene, female    DOB: Oct 11, 1986, 33 y.o.   MRN: [id number]  HPI    Review of Systems  All other systems reviewed and are negative.      Objective:   Physical Exam        Assessment & Plan:

## 2019-11-27 ENCOUNTER — Telehealth: Payer: Self-pay | Admitting: *Deleted

## 2019-11-27 DIAGNOSIS — M79676 Pain in unspecified toe(s): Secondary | ICD-10-CM

## 2019-11-27 NOTE — Telephone Encounter (Signed)
Patient is bringing in paperwork from her job to be completed for her job to accommodate her foot pain situation.

## 2019-11-27 NOTE — Telephone Encounter (Signed)
Called patient to let her know that per Dr. Posey Pronto if pain gets worse, may come in tomorrow for an emergency visit on Saturday, she verbalized understanding and said she would if pain became worse overnight.

## 2019-11-27 NOTE — Telephone Encounter (Signed)
Patient called and said that her foot is hurting worse than before the injection received 1 day ago.(Dr. Paulla Dolly).  The injection only helped for 1 hour, now she  is having constant aching pain, so bad that she almost went to ER last night. Please call.

## 2019-11-27 NOTE — Telephone Encounter (Signed)
Just FYI for Saturday

## 2019-12-03 ENCOUNTER — Other Ambulatory Visit: Payer: Self-pay

## 2019-12-03 ENCOUNTER — Ambulatory Visit (INDEPENDENT_AMBULATORY_CARE_PROVIDER_SITE_OTHER): Payer: No Typology Code available for payment source | Admitting: Podiatry

## 2019-12-03 DIAGNOSIS — M779 Enthesopathy, unspecified: Secondary | ICD-10-CM

## 2019-12-07 NOTE — Progress Notes (Signed)
Subjective:   Patient ID: Wendy Greene, female   DOB: 33 y.o.   MRN: [id number]   HPI Patient states she seems to be improving but still having pain in the plantar aspect of the right forefoot   ROS      Objective:  Physical Exam  Neurovascular status intact with patient noted to have inflammation pain of the right second MPJ with fluid buildup noted     Assessment:  Inflammatory capsulitis improved but still present right     Plan:  Reviewed condition recommended continued boot usage to immobilize with gradual reduction of this over the next few weeks.  If symptoms were to persist will need to consider other treatments but at this point we will get a try to hold off and see if it will respond to continued conservative care

## 2019-12-31 ENCOUNTER — Encounter: Payer: Self-pay | Admitting: Podiatry

## 2019-12-31 ENCOUNTER — Ambulatory Visit (INDEPENDENT_AMBULATORY_CARE_PROVIDER_SITE_OTHER): Payer: No Typology Code available for payment source | Admitting: Podiatry

## 2019-12-31 ENCOUNTER — Other Ambulatory Visit: Payer: Self-pay

## 2019-12-31 DIAGNOSIS — M779 Enthesopathy, unspecified: Secondary | ICD-10-CM

## 2019-12-31 DIAGNOSIS — G629 Polyneuropathy, unspecified: Secondary | ICD-10-CM

## 2019-12-31 MED ORDER — GABAPENTIN 300 MG PO CAPS: 300.0000 mg | ORAL_CAPSULE | Freq: Three times a day (TID) | ORAL | 3 refills | Status: DC

## 2020-01-04 NOTE — Progress Notes (Signed)
Subjective:   Patient ID: Wendy Greene, female   DOB: 34 y.o.   MRN: [id number]   HPI Patient states doing a lot better with pain still noted upon deep palpation but quite a bit improved   ROS      Objective:  Physical Exam  Neurovascular status intact with significant diminishment of discomfort in the MPJ with pain still present upon deep palpation but overall much better     Assessment:  Inflammatory capsulitis improving     Plan:  H&P reviewed condition recommended continuation of stiff bottom shoes anti-inflammatory supportive therapy and patient will be seen back to recheck as needed and I went over at great length different treatment options that we will need to consider if symptoms persist

## 2020-01-23 ENCOUNTER — Other Ambulatory Visit: Payer: Self-pay

## 2020-01-23 DIAGNOSIS — Z20822 Contact with and (suspected) exposure to covid-19: Secondary | ICD-10-CM

## 2020-01-24 LAB — NOVEL CORONAVIRUS, NAA: SARS-CoV-2, NAA: NOT DETECTED

## 2020-03-12 ENCOUNTER — Encounter (HOSPITAL_COMMUNITY): Payer: Self-pay | Admitting: Emergency Medicine

## 2020-03-12 ENCOUNTER — Other Ambulatory Visit: Payer: Self-pay

## 2020-03-12 ENCOUNTER — Emergency Department (HOSPITAL_COMMUNITY): Payer: Medicaid Other

## 2020-03-12 ENCOUNTER — Emergency Department (HOSPITAL_COMMUNITY)
Admission: EM | Admit: 2020-03-12 | Discharge: 2020-03-12 | Disposition: A | Discharge status: Home / Self Care | Payer: Medicaid Other | Attending: Emergency Medicine | Admitting: Emergency Medicine

## 2020-03-12 DIAGNOSIS — Z79899 Other long term (current) drug therapy: Secondary | ICD-10-CM | POA: Insufficient documentation

## 2020-03-12 DIAGNOSIS — R110 Nausea: Secondary | ICD-10-CM | POA: Insufficient documentation

## 2020-03-12 DIAGNOSIS — Z87891 Personal history of nicotine dependence: Secondary | ICD-10-CM | POA: Insufficient documentation

## 2020-03-12 DIAGNOSIS — K5792 Diverticulitis of intestine, part unspecified, without perforation or abscess without bleeding: Secondary | ICD-10-CM | POA: Insufficient documentation

## 2020-03-12 LAB — URINALYSIS, ROUTINE W REFLEX MICROSCOPIC
Bilirubin Urine: NEGATIVE
Glucose, UA: NEGATIVE mg/dL
Hgb urine dipstick: NEGATIVE
Ketones, ur: 20 mg/dL — AB
Leukocytes,Ua: NEGATIVE
Nitrite: NEGATIVE
Protein, ur: NEGATIVE mg/dL
Specific Gravity, Urine: 1.02 (ref 1.005–1.030)
pH: 7 (ref 5.0–8.0)

## 2020-03-12 LAB — COMPREHENSIVE METABOLIC PANEL
ALT: 16 U/L (ref 0–44)
AST: 21 U/L (ref 15–41)
Albumin: 4.3 g/dL (ref 3.5–5.0)
Alkaline Phosphatase: 73 U/L (ref 38–126)
Anion gap: 11 (ref 5–15)
BUN: 9 mg/dL (ref 6–20)
CO2: 25 mmol/L (ref 22–32)
Calcium: 9.4 mg/dL (ref 8.9–10.3)
Chloride: 101 mmol/L (ref 98–111)
Creatinine, Ser: 0.91 mg/dL (ref 0.44–1.00)
GFR calc Af Amer: >60 mL/min (ref >60)
GFR calc non Af Amer: >60 mL/min (ref >60)
Glucose, Bld: 88 mg/dL (ref 70–99)
Potassium: 3.7 mmol/L (ref 3.5–5.1)
Sodium: 137 mmol/L (ref 135–145)
Total Bilirubin: 0.8 mg/dL (ref 0.3–1.2)
Total Protein: 8.2 g/dL — ABNORMAL HIGH (ref 6.5–8.1)

## 2020-03-12 LAB — CBC
HCT: 48.8 % — ABNORMAL HIGH (ref 36.0–46.0)
Hemoglobin: 15.4 g/dL — ABNORMAL HIGH (ref 12.0–15.0)
MCH: 28.2 pg (ref 26.0–34.0)
MCHC: 31.6 g/dL (ref 30.0–36.0)
MCV: 89.2 fL (ref 80.0–100.0)
Platelets: 306 10*3/uL (ref 150–400)
RBC: 5.47 MIL/uL — ABNORMAL HIGH (ref 3.87–5.11)
RDW: 12.6 % (ref 11.5–15.5)
WBC: 10.1 10*3/uL (ref 4.0–10.5)
nRBC: 0 % (ref 0.0–0.2)

## 2020-03-12 LAB — I-STAT BETA HCG BLOOD, ED (MC, WL, AP ONLY): I-stat hCG, quantitative: <5 m[IU]/mL (ref <5)

## 2020-03-12 LAB — LIPASE, BLOOD: Lipase: 25 U/L (ref 11–51)

## 2020-03-12 MED ORDER — IOHEXOL 300 MG/ML  SOLN
100.0000 mL | Freq: Once | INTRAMUSCULAR | Status: AC | PRN
  Administered 2020-03-12: 100 mL via INTRAVENOUS

## 2020-03-12 MED ORDER — METRONIDAZOLE 500 MG PO TABS
500.0000 mg | ORAL_TABLET | Freq: Once | ORAL | Status: AC
  Administered 2020-03-12: 500 mg via ORAL
  Filled 2020-03-12: qty 1

## 2020-03-12 MED ORDER — ONDANSETRON HCL 4 MG PO TABS: 4.0000 mg | ORAL_TABLET | Freq: Four times a day (QID) | ORAL | 0 refills | Status: DC

## 2020-03-12 MED ORDER — KETOROLAC TROMETHAMINE 15 MG/ML IJ SOLN
15.0000 mg | Freq: Once | INTRAMUSCULAR | Status: AC
  Administered 2020-03-12: 15 mg via INTRAVENOUS
  Filled 2020-03-12: qty 1

## 2020-03-12 MED ORDER — SODIUM CHLORIDE 0.9 % IV BOLUS
1000.0000 mL | Freq: Once | INTRAVENOUS | Status: AC
  Administered 2020-03-12: 1000 mL via INTRAVENOUS

## 2020-03-12 MED ORDER — METRONIDAZOLE 500 MG PO TABS: 500.0000 mg | ORAL_TABLET | Freq: Two times a day (BID) | ORAL | 0 refills | Status: DC

## 2020-03-12 MED ORDER — SODIUM CHLORIDE 0.9% FLUSH
3.0000 mL | Freq: Once | INTRAVENOUS | Status: AC
  Administered 2020-03-12: 3 mL via INTRAVENOUS

## 2020-03-12 MED ORDER — CIPROFLOXACIN HCL 500 MG PO TABS: 500.0000 mg | ORAL_TABLET | Freq: Two times a day (BID) | ORAL | 0 refills | Status: DC

## 2020-03-12 MED ORDER — ONDANSETRON HCL 4 MG/2ML IJ SOLN
4.0000 mg | Freq: Once | INTRAMUSCULAR | Status: AC
  Administered 2020-03-12: 4 mg via INTRAVENOUS
  Filled 2020-03-12: qty 2

## 2020-03-12 MED ORDER — CIPROFLOXACIN HCL 500 MG PO TABS
500.0000 mg | ORAL_TABLET | Freq: Once | ORAL | Status: AC
  Administered 2020-03-12: 500 mg via ORAL
  Filled 2020-03-12: qty 1

## 2020-03-12 NOTE — ED Triage Notes (Signed)
C/o LLQ pain since Thursday.  States initially just felt bloated but now pain is sharp.  Reports lower back pain and nausea that started this morning.  Denies vomiting, diarrhea, and urinary complaints.

## 2020-03-12 NOTE — ED Provider Notes (Signed)
[facility] EMERGENCY DEPARTMENT Provider Note   CSN: [id number] Arrival date & time: 03/12/20  1042     History Chief Complaint  Patient presents with   Abdominal Pain    Wendy Greene is a 34 y.o. female.  34 year old female with prior medical history as detailed below presents for evaluation of left lower quadrant abdominal pain.  Patient reports gradual onset of abdominal pain 3 days prior.  Pain is worsened over the last 48 hours.  She denies vomiting.  She does report nausea.  She denies fever.  She denies change in her bowel movements.  She denies urinary symptoms.  She denies prior abdominal surgery.  Her last menstrual period was in February.  She does not think that she is pregnant.  She denies vaginal discharge or bleeding.  She denies prior episodes of pain similar to reported symptoms today.  She has not taken anything at home for her symptoms.  Pain is made worse by movement or palpation of the area.  The history is provided by the patient and medical records.  Abdominal Pain Pain location:  LLQ Pain quality: aching, bloating and cramping   Pain radiates to:  Does not radiate Pain severity:  Moderate Onset quality:  Gradual Duration:  3 days Timing:  Constant Progression:  Worsening Chronicity:  New Relieved by:  Not moving Worsened by:  Movement and palpation Ineffective treatments:  None tried      History reviewed. No pertinent past medical history.  There are no problems to display for this patient.   Past Surgical History:  Procedure Laterality Date   THYROIDECTOMY, PARTIAL  2011     OB History    Gravida  2   Para  2   Term  0   Preterm  2   AB  0   Living  2     SAB  0   TAB  0   Ectopic  0   Multiple  0   Live Births  2           Family History  Problem Relation Age of Onset   Hypertension Mother    Hypertension Father     Social History   Tobacco Use   Smoking status: Former Smoker     Quit date: 03/09/2015    Years since quitting: 5.0   Smokeless tobacco: Never Used  Substance Use Topics   Alcohol use: Yes    Comment: soc   Drug use: No    Home Medications Prior to Admission medications   Medication Sig Start Date End Date Taking? Authorizing Provider  diclofenac (VOLTAREN) 50 MG EC tablet Take 1 tablet (50 mg total) by mouth 2 (two) times daily. 11/13/19   Georgetta Haber, NP  diclofenac (VOLTAREN) 75 MG EC tablet Take 1 tablet (75 mg total) by mouth 2 (two) times daily. 11/26/19   Lenn Sink, DPM  gabapentin (NEURONTIN) 300 MG capsule Take 1 capsule (300 mg total) by mouth 3 (three) times daily. 12/31/19   Lenn Sink, DPM    Allergies    Patient has no known allergies.  Review of Systems   Review of Systems  Gastrointestinal: Positive for abdominal pain.  All other systems reviewed and are negative.   Physical Exam Updated Vital Signs BP 118/82 (BP Location: Right Arm)    Pulse (!) 101    Temp 98.5 F (36.9 C) (Oral)    Resp 18    Ht 5\' 3"  (1.6  m)    Wt 57.6 kg    LMP 02/17/2020    SpO2 99%    BMI 22.50 kg/m   Physical Exam Vitals and nursing note reviewed.  Constitutional:      General: She is not in acute distress.    Appearance: She is well-developed.  HENT:     Head: Normocephalic and atraumatic.  Eyes:     Conjunctiva/sclera: Conjunctivae normal.     Pupils: Pupils are equal, round, and reactive to light.  Cardiovascular:     Rate and Rhythm: Normal rate and regular rhythm.     Heart sounds: Normal heart sounds.  Pulmonary:     Effort: Pulmonary effort is normal. No respiratory distress.     Breath sounds: Normal breath sounds.  Abdominal:     General: There is no distension.     Palpations: Abdomen is soft.     Tenderness: There is abdominal tenderness in the left lower quadrant.     Comments: Moderate tenderness with palpation to the left lower quadrant.  No rebound or guarding.  No CVA tenderness bilaterally.    Musculoskeletal:        General: No deformity. Normal range of motion.     Cervical back: Normal range of motion and neck supple.  Skin:    General: Skin is warm and dry.  Neurological:     Mental Status: She is alert and oriented to person, place, and time.     ED Results / Procedures / Treatments   Labs (all labs ordered are listed, but only abnormal results are displayed) Labs Reviewed  COMPREHENSIVE METABOLIC PANEL - Abnormal; Notable for the following components:      Result Value   Total Protein 8.2 (*)    All other components within normal limits  CBC - Abnormal; Notable for the following components:   RBC 5.47 (*)    Hemoglobin 15.4 (*)    HCT 48.8 (*)    All other components within normal limits  URINALYSIS, ROUTINE W REFLEX MICROSCOPIC - Abnormal; Notable for the following components:   Ketones, ur 20 (*)    All other components within normal limits  LIPASE, BLOOD  I-STAT BETA HCG BLOOD, ED ([facility], [facility], [facility] ONLY)    EKG None  Radiology CT ABDOMEN PELVIS W CONTRAST  Result Date: 03/12/2020 CLINICAL DATA:  CT A/P with contrast due to LLQ abd pain C/o LLQ pain since Thursday. States initially just felt bloated but now pain is sharp. EXAM: CT ABDOMEN AND PELVIS WITH CONTRAST TECHNIQUE: Multidetector CT imaging of the abdomen and pelvis was performed using the standard protocol following bolus administration of intravenous contrast. CONTRAST:  153mL OMNIPAQUE IOHEXOL 300 MG/ML  SOLN COMPARISON:  None. FINDINGS: Lower chest: No acute abnormality. Hepatobiliary: No focal liver abnormality is seen. No gallstones, gallbladder wall thickening, or biliary dilatation. Pancreas: Unremarkable. No pancreatic ductal dilatation or surrounding inflammatory changes. Spleen: Normal in size without focal abnormality. Adrenals/Urinary Tract: Adrenal glands are unremarkable. Kidneys are normal, without renal calculi, focal lesion, or hydronephrosis. Bladder is unremarkable. Stomach/Bowel: Stomach  is within normal limits. Appendix appears normal. There is a segment of bowel wall thickening and submucosal edema as well as fat stranding in the mid descending colon with what appears to be an inflamed diverticula. No free air or pericolonic fluid collection. The remainder of the bowel is normal in appearance. Vascular/Lymphatic: No significant vascular findings are present. No enlarged abdominal or pelvic lymph nodes. Reproductive: Uterus is unremarkable. There is mild to  moderate free fluid in the pelvis which may be physiologic. Other: No abdominal wall hernia or abnormality. No abdominopelvic ascites. Musculoskeletal: No acute or significant osseous findings. IMPRESSION: 1. Short segment inflammatory changes in the mid descending colon with an inflamed diverticula, most consistent with acute diverticulitis. No free air or pericolonic fluid collection. 2. Mild to moderate free fluid in the pelvis, may be physiologic. Consider nonemergent pelvic ultrasound for further evaluation. Electronically Signed   By: Emmaline Kluver M.D.   On: 03/12/2020 13:09    Procedures Procedures (including critical care time)  Medications Ordered in ED Medications  sodium chloride flush (NS) 0.9 % injection 3 mL (3 mLs Intravenous Given 03/12/20 1135)  sodium chloride 0.9 % bolus 1,000 mL (1,000 mLs Intravenous New Bag/Given 03/12/20 1134)  ondansetron (ZOFRAN) injection 4 mg (4 mg Intravenous Given 03/12/20 1136)  ketorolac (TORADOL) 15 MG/ML injection 15 mg (15 mg Intravenous Given 03/12/20 1136)    ED Course  I have reviewed the triage vital signs and the nursing notes.  Pertinent labs & imaging results that were available during my care of the patient were reviewed by me and considered in my medical decision making (see chart for details).    MDM Rules/Calculators/A&P                      MDM  Screen complete  Wendy Greene was evaluated in Emergency Department on 03/12/2020 for the symptoms described  in the history of present illness. She was evaluated in the context of the global COVID-19 pandemic, which necessitated consideration that the patient might be at risk for infection with the SARS-CoV-2 virus that causes COVID-19. Institutional protocols and algorithms that pertain to the evaluation of patients at risk for COVID-19 are in a state of rapid change based on information released by regulatory bodies including the CDC and federal and state organizations. These policies and algorithms were followed during the patient's care in the ED.  Patient is presenting for evaluation of reported left lower quadrant abdominal pain.  Work-up is suggestive of likely diverticulitis.  Patient's symptoms are significantly improved following her ED evaluation.  She was offered and declines admission.  She desires discharge home.  Patient does understand the need for close follow-up.  She does understand the need to take antibiotics until completion of her prescription.  She does understand that if her pain worsens, she has worsening nausea, or she develops a fever she should return to the ED for evaluation.    Final Clinical Impression(s) / ED Diagnoses Final diagnoses:  Diverticulitis    Rx / DC Orders ED Discharge Orders         Ordered    ciprofloxacin (CIPRO) 500 MG tablet  2 times daily     03/12/20 1338    metroNIDAZOLE (FLAGYL) 500 MG tablet  2 times daily     03/12/20 1338    ondansetron (ZOFRAN) 4 MG tablet  Every 6 hours     03/12/20 1338           Wynetta Fines, MD 03/12/20 1340

## 2020-03-12 NOTE — Discharge Instructions (Signed)
Return for any problem.  Follow-up with your regular care provider as instructed.  If you experience worsening pain, vomiting, high fever, or other worsening symptoms please return to the ED for evaluation.

## 2020-03-28 ENCOUNTER — Other Ambulatory Visit: Payer: Self-pay

## 2020-03-28 DIAGNOSIS — Z20822 Contact with and (suspected) exposure to covid-19: Secondary | ICD-10-CM

## 2020-03-29 LAB — NOVEL CORONAVIRUS, NAA: SARS-CoV-2, NAA: NOT DETECTED

## 2020-03-29 LAB — SARS-COV-2, NAA 2 DAY TAT

## 2020-12-13 ENCOUNTER — Emergency Department (HOSPITAL_COMMUNITY): Payer: Worker's Compensation

## 2020-12-13 ENCOUNTER — Emergency Department (HOSPITAL_COMMUNITY)
Admission: EM | Admit: 2020-12-13 | Discharge: 2020-12-13 | Disposition: A | Discharge status: Home / Self Care | Payer: Worker's Compensation | Attending: Emergency Medicine | Admitting: Emergency Medicine

## 2020-12-13 ENCOUNTER — Other Ambulatory Visit: Payer: Self-pay

## 2020-12-13 ENCOUNTER — Encounter (HOSPITAL_COMMUNITY): Payer: Self-pay

## 2020-12-13 DIAGNOSIS — W228XXA Striking against or struck by other objects, initial encounter: Secondary | ICD-10-CM | POA: Insufficient documentation

## 2020-12-13 DIAGNOSIS — Z87891 Personal history of nicotine dependence: Secondary | ICD-10-CM | POA: Diagnosis not present

## 2020-12-13 DIAGNOSIS — Y990 Civilian activity done for income or pay: Secondary | ICD-10-CM | POA: Insufficient documentation

## 2020-12-13 DIAGNOSIS — Z7982 Long term (current) use of aspirin: Secondary | ICD-10-CM | POA: Insufficient documentation

## 2020-12-13 DIAGNOSIS — S0990XA Unspecified injury of head, initial encounter: Secondary | ICD-10-CM | POA: Diagnosis present

## 2020-12-13 LAB — I-STAT BETA HCG BLOOD, ED (MC, WL, AP ONLY): I-stat hCG, quantitative: <5 m[IU]/mL (ref <5)

## 2020-12-13 MED ORDER — DIPHENHYDRAMINE HCL 50 MG/ML IJ SOLN
25.0000 mg | Freq: Once | INTRAMUSCULAR | Status: AC
  Administered 2020-12-13: 25 mg via INTRAVENOUS
  Filled 2020-12-13: qty 1

## 2020-12-13 MED ORDER — SODIUM CHLORIDE 0.9 % IV BOLUS
1000.0000 mL | Freq: Once | INTRAVENOUS | Status: AC
  Administered 2020-12-13: 1000 mL via INTRAVENOUS

## 2020-12-13 MED ORDER — DEXAMETHASONE SODIUM PHOSPHATE 10 MG/ML IJ SOLN
10.0000 mg | Freq: Once | INTRAMUSCULAR | Status: AC
  Administered 2020-12-13: 10 mg via INTRAVENOUS
  Filled 2020-12-13: qty 1

## 2020-12-13 MED ORDER — KETOROLAC TROMETHAMINE 30 MG/ML IJ SOLN
30.0000 mg | Freq: Once | INTRAMUSCULAR | Status: AC
  Administered 2020-12-13: 30 mg via INTRAVENOUS
  Filled 2020-12-13: qty 1

## 2020-12-13 MED ORDER — PROCHLORPERAZINE EDISYLATE 10 MG/2ML IJ SOLN
10.0000 mg | Freq: Once | INTRAMUSCULAR | Status: AC
  Administered 2020-12-13: 10 mg via INTRAVENOUS
  Filled 2020-12-13: qty 2

## 2020-12-13 NOTE — ED Triage Notes (Signed)
Patient arrived stating she hit her head on the back of a shelf yesterday. Arrived with complaints of a headache with photosensitivity, took Tylenol with some improvement. No complaints of NV.

## 2020-12-13 NOTE — ED Provider Notes (Signed)
[facility]-EMERGENCY DEPT Provider Note   CSN: [id number] Arrival date & time: 12/13/20  [id number]     History Chief Complaint  Patient presents with  . Headache    Cleophus MoltStephanie Schoenfeldt is a 34 y.o. female who presents for evaluation of headache that began yesterday after hitting her head on a shelf.  Patient reports she was at work and states that she was bending down and stood up.  She hit her head on a shelf.  She states that since then, she has had a headache.  She did not have any LOC.  She is not on blood thinners.  She states that she has had nausea and photosensitivity since then as well.  She tried taking over-the-counter meds for headache but no improvement.  She states that she has a history of migraines but states she has not been formally diagnosed with them.  States that this feels slightly different than her migraine.  She describes it as a thumping pressure.  She has not had any blurry vision, vomiting, numbness/weakness of her arms or legs.  He denies any fever.  The history is provided by the patient.       History reviewed. No pertinent past medical history.  There are no problems to display for this patient.   Past Surgical History:  Procedure Laterality Date  . THYROIDECTOMY, PARTIAL  2011     OB History    Gravida  2   Para  2   Term  0   Preterm  2   AB  0   Living  2     SAB  0   IAB  0   Ectopic  0   Multiple  0   Live Births  2           Family History  Problem Relation Age of Onset  . Hypertension Mother   . Hypertension Father     Social History   Tobacco Use  . Smoking status: Former Smoker    Quit date: 03/09/2015    Years since quitting: 5.7  . Smokeless tobacco: Never Used  Substance Use Topics  . Alcohol use: Yes    Comment: soc  . Drug use: No    Home Medications Prior to Admission medications   Medication Sig Start Date End Date Taking? Authorizing Provider  acetaminophen (TYLENOL) 500 MG  tablet Take 500 mg by mouth every 6 (six) hours as needed for headache.   Yes [provider]  Aspirin-Salicylamide-Caffeine (BC HEADACHE PO) Take 1 packet by mouth daily as needed (headache).   Yes [provider]  ciprofloxacin (CIPRO) 500 MG tablet Take 1 tablet (500 mg total) by mouth 2 (two) times daily. Patient not taking: No sig reported 03/12/20   Wynetta FinesMessick, Peter C, MD  diclofenac (VOLTAREN) 50 MG EC tablet Take 1 tablet (50 mg total) by mouth 2 (two) times daily. Patient not taking: No sig reported 11/13/19   Linus MakoBurky, Natalie B, NP  diclofenac (VOLTAREN) 75 MG EC tablet Take 1 tablet (75 mg total) by mouth 2 (two) times daily. Patient not taking: No sig reported 11/26/19   Lenn Sinkegal, Norman S, DPM  gabapentin (NEURONTIN) 300 MG capsule Take 1 capsule (300 mg total) by mouth 3 (three) times daily. Patient not taking: No sig reported 12/31/19   Lenn Sinkegal, Norman S, DPM  metroNIDAZOLE (FLAGYL) 500 MG tablet Take 1 tablet (500 mg total) by mouth 2 (two) times daily. Patient not taking: No sig reported 03/12/20  Wynetta Fines, MD  ondansetron (ZOFRAN) 4 MG tablet Take 1 tablet (4 mg total) by mouth every 6 (six) hours. Patient not taking: No sig reported 03/12/20   Wynetta Fines, MD    Allergies    Patient has no known allergies.  Review of Systems   Review of Systems  Constitutional: Negative for fever.  Eyes: Positive for photophobia.  Respiratory: Negative for cough and shortness of breath.   Cardiovascular: Negative for chest pain.  Gastrointestinal: Positive for nausea. Negative for abdominal pain and vomiting.  Neurological: Positive for headaches. Negative for weakness and numbness.  All other systems reviewed and are negative.   Physical Exam Updated Vital Signs BP 103/67   Pulse 79   Temp 98.5 F (36.9 C)   Resp 12   Ht 5\' 3"  (1.6 m)   Wt 57.2 kg   LMP 12/07/2020   SpO2 99%   BMI 22.32 kg/m   Physical Exam Vitals and nursing note reviewed.   Constitutional:      Appearance: Normal appearance. She is well-developed and well-nourished.  HENT:     Head: Normocephalic and atraumatic.      Comments: Tenderness palpation noted to the top of head.  No underlying skull deformity or crepitus noted.    Mouth/Throat:     Mouth: Oropharynx is clear and moist and mucous membranes are normal.  Eyes:     General: Lids are normal.     Extraocular Movements: EOM normal.     Conjunctiva/sclera: Conjunctivae normal.     Pupils: Pupils are equal, round, and reactive to light.     Comments: PERRL. EOMs intact. No nystagmus. No neglect.   Neck:     Comments: Neck is supple and without rigidity  Cardiovascular:     Rate and Rhythm: Normal rate and regular rhythm.     Pulses: Normal pulses.     Heart sounds: Normal heart sounds. No murmur heard. No friction rub. No gallop.   Pulmonary:     Effort: Pulmonary effort is normal.     Breath sounds: Normal breath sounds.  Abdominal:     Palpations: Abdomen is soft. Abdomen is not rigid.     Tenderness: There is no abdominal tenderness. There is no guarding.  Musculoskeletal:        General: Normal range of motion.     Cervical back: Full passive range of motion without pain.  Skin:    General: Skin is warm and dry.     Capillary Refill: Capillary refill takes less than 2 seconds.  Neurological:     Mental Status: She is alert and oriented to person, place, and time.     Comments: Cranial nerves III-XII intact Follows commands, Moves all extremities  5/5 strength to BUE and BLE  Sensation intact throughout all major nerve distributions No pronator drift. No gait abnormalities  No slurred speech. No facial droop.   Psychiatric:        Mood and Affect: Mood and affect normal.        Speech: Speech normal.     ED Results / Procedures / Treatments   Labs (all labs ordered are listed, but only abnormal results are displayed) Labs Reviewed  I-STAT BETA HCG BLOOD, ED ([facility], [facility], [facility] ONLY)     EKG None  Radiology CT Head Wo Contrast  Result Date: 12/13/2020 CLINICAL DATA:  Headache. Additional history provided: Patient reports hitting her head on a shelf 12/12/2020, headache and photosensitivity. EXAM: CT HEAD WITHOUT  CONTRAST TECHNIQUE: Contiguous axial images were obtained from the base of the skull through the vertex without intravenous contrast. COMPARISON:  No pertinent prior exams available for comparison. FINDINGS: Brain: Cerebral volume is normal. There is no acute intracranial hemorrhage. No demarcated cortical infarct. No extra-axial fluid collection. No evidence of intracranial mass. No midline shift. Vascular: No hyperdense vessel. Skull: Normal. Negative for fracture or focal lesion. Sinuses/Orbits: Visualized orbits show no acute finding. No significant paranasal sinus disease at the imaged levels. IMPRESSION: Unremarkable non-contrast CT appearance of the brain. No evidence of acute intracranial abnormality. Electronically Signed   By: Jackey Loge DO   On: 12/13/2020 08:06    Procedures Procedures (including critical care time)  Medications Ordered in ED Medications  prochlorperazine (COMPAZINE) injection 10 mg (10 mg Intravenous Given 12/13/20 0741)  diphenhydrAMINE (BENADRYL) injection 25 mg (25 mg Intravenous Given 12/13/20 0741)  ketorolac (TORADOL) 30 MG/ML injection 30 mg (30 mg Intravenous Given 12/13/20 0815)  sodium chloride 0.9 % bolus 1,000 mL (0 mLs Intravenous Stopped 12/13/20 0947)  dexamethasone (DECADRON) injection 10 mg (10 mg Intravenous Given 12/13/20 0947)    ED Course  I have reviewed the triage vital signs and the nursing notes.  Pertinent labs & imaging results that were available during my care of the patient were reviewed by me and considered in my medical decision making (see chart for details).    MDM Rules/Calculators/A&P                          34 year old female who presents for evaluation of headache status post hitting  her head on a shelf yesterday.  No LOC.  She is not on blood thinners.  Associated photosensitivity, nausea.  On initial arrival, she is afebrile, nontoxic-appearing.  Vital signs are stable.  On exam, she does have some tenderness noted to their skull but no underlying skull deformity or crepitus noted.  No neurological deficits noted on exam.  She tells me that this feels different than her normal migraine.  She has no formal diagnosis has not been seen by neurology but states she will get migraine headaches every once in a while.  Given history of trauma and the fact that she states that this feels different, will obtain imaging.  I suspect this is most likely minor head injury with may be some postconcussion syndrome.  Low suspicion for intracranial hemorrhage given the mechanism of injury.  Plan for migraine cocktail.  CT head negative for any acute abnormality.   Reevaluation.  Patient reports headache is improved.  I ambulated patient in the room.  No gait abnormalities.  Patient reports feeling better.  We discussed minor head injury/concussion precautions.  We'll give her ambulatory referral to neurology given history of migraines that she can follow-up with.  At this time, patient with reassuring exam.  At this time, patient exhibits no emergent life-threatening condition that require further evaluation in ED. Patient had ample opportunity for questions and discussion. All patient's questions were answered with full understanding. Strict return precautions discussed. Patient expresses understanding and agreement to plan.   Portions of this note were generated with Scientist, clinical (histocompatibility and immunogenetics). Dictation errors may occur despite best attempts at proofreading.   Final Clinical Impression(s) / ED Diagnoses Final diagnoses:  Minor head injury, initial encounter    Rx / DC Orders ED Discharge Orders         Ordered    Ambulatory referral to Neurology  Comments: An appointment is requested in  approximately: 2 weeks   12/13/20 1007           Rosana Hoes 12/13/20 1040    Gwyneth Sprout, MD 12/13/20 1515

## 2020-12-13 NOTE — ED Notes (Signed)
Pt ambulatory to ED room 6. Patient A&O x4. Pt placed on monitor. Lights turned off.

## 2020-12-13 NOTE — Discharge Instructions (Signed)
As we discussed, your CT scan looked good here.  As we discussed, sometimes with head injury is coming from a postconcussion syndrome.  Sometimes this can cause headaches, difficulty concentrating, light sensitivity, fatigue.  We suggest refraining from physical activity for 2 weeks.  Additionally, he should engage in brain rest which means limiting the amount of screen, phone, TV time.  If your symptoms persist more than 4 weeks, you can follow-up with the concussion clinic.  I have also provided you an ambulatory referral to neurology given your history of migraines.  Return the emergency department for any worsening headache, vomiting, numbness/weakness in your arms or legs or any other worsening or concerning symptoms.

## 2020-12-13 NOTE — ED Notes (Signed)
PA in room

## 2020-12-18 ENCOUNTER — Other Ambulatory Visit: Payer: Self-pay

## 2020-12-18 ENCOUNTER — Emergency Department (HOSPITAL_COMMUNITY)
Admission: EM | Admit: 2020-12-18 | Discharge: 2020-12-18 | Disposition: A | Discharge status: Home / Self Care | Payer: Worker's Compensation | Attending: Emergency Medicine | Admitting: Emergency Medicine

## 2020-12-18 ENCOUNTER — Encounter (HOSPITAL_COMMUNITY): Payer: Self-pay | Admitting: Emergency Medicine

## 2020-12-18 DIAGNOSIS — Z79899 Other long term (current) drug therapy: Secondary | ICD-10-CM | POA: Diagnosis not present

## 2020-12-18 DIAGNOSIS — W228XXD Striking against or struck by other objects, subsequent encounter: Secondary | ICD-10-CM | POA: Insufficient documentation

## 2020-12-18 DIAGNOSIS — R519 Headache, unspecified: Secondary | ICD-10-CM

## 2020-12-18 DIAGNOSIS — Z87891 Personal history of nicotine dependence: Secondary | ICD-10-CM | POA: Insufficient documentation

## 2020-12-18 DIAGNOSIS — S060X0D Concussion without loss of consciousness, subsequent encounter: Secondary | ICD-10-CM | POA: Diagnosis not present

## 2020-12-18 LAB — POC URINE PREG, ED: Preg Test, Ur: NEGATIVE

## 2020-12-18 MED ORDER — KETOROLAC TROMETHAMINE 30 MG/ML IJ SOLN
30.0000 mg | Freq: Once | INTRAMUSCULAR | Status: AC
  Administered 2020-12-18: 30 mg via INTRAVENOUS
  Filled 2020-12-18: qty 1

## 2020-12-18 MED ORDER — MAGNESIUM SULFATE 2 GM/50ML IV SOLN
2.0000 g | Freq: Once | INTRAVENOUS | Status: AC
  Administered 2020-12-18: 2 g via INTRAVENOUS
  Filled 2020-12-18: qty 50

## 2020-12-18 MED ORDER — DEXAMETHASONE SODIUM PHOSPHATE 10 MG/ML IJ SOLN
8.0000 mg | Freq: Once | INTRAMUSCULAR | Status: AC
  Administered 2020-12-18: 8 mg via INTRAVENOUS
  Filled 2020-12-18: qty 1

## 2020-12-18 MED ORDER — METOCLOPRAMIDE HCL 10 MG PO TABS: 10.0000 mg | ORAL_TABLET | Freq: Four times a day (QID) | ORAL | 0 refills | Status: DC | PRN

## 2020-12-18 MED ORDER — DIPHENHYDRAMINE HCL 50 MG/ML IJ SOLN
25.0000 mg | Freq: Once | INTRAMUSCULAR | Status: AC
  Administered 2020-12-18: 25 mg via INTRAVENOUS
  Filled 2020-12-18: qty 1

## 2020-12-18 MED ORDER — SODIUM CHLORIDE 0.9 % IV BOLUS
1000.0000 mL | Freq: Once | INTRAVENOUS | Status: AC
  Administered 2020-12-18: 1000 mL via INTRAVENOUS

## 2020-12-18 MED ORDER — METOCLOPRAMIDE HCL 5 MG/ML IJ SOLN
10.0000 mg | Freq: Once | INTRAMUSCULAR | Status: AC
  Administered 2020-12-18: 10 mg via INTRAVENOUS
  Filled 2020-12-18: qty 2

## 2020-12-18 NOTE — ED Triage Notes (Signed)
Patient reports hitting head on back of shelf on 12/20. Reports being seen at [facility] and had a CT scan and was discharged. Reports headache has worsened since then. Reports light and noise sensitivity. Took excedrin migraine and tylenol w/ no relief.

## 2020-12-18 NOTE — Discharge Instructions (Addendum)
Schedule an appointment with the concussion clinic, Dr. Antoine Primas.  Continue treating your headache with over-the-counter medications as needed. You can take the Reglan every 6 hours as needed for headache, nausea or vomiting.  You can take Benadryl with this for any undesirable side effects. Follow closely with primary care.  Return the emergency department for severely worsening headache, vomiting, double or loss of vision, weakness, problems with balance, or other concerning symptoms.

## 2020-12-18 NOTE — ED Provider Notes (Signed)
[facility]-EMERGENCY DEPT Provider Note   CSN: [id number] Arrival date & time: 12/18/20  1207     History Chief Complaint  Patient presents with  . Headache    Wendy Greene is a 34 y.o. female presenting to the emergency department with complaint of headache.  Patient was evaluated on 12/13/2020 for minor head injury.  She states she was bending over when she stood up she hit the top of her head  on a bookshelf out loss of consciousness.  She was having headache with photophobia at the time of her evaluation.  Per chart review she had negative CT imaging of her head and was discharged with concussion precautions and referral to concussion clinic.  She states she has been having persistent waxing and waning throbbing headache with photophobia and nausea without vomiting.  She has been unable to successfully treat her headache at home with over-the-counter medications including BC powder, ibuprofen, Tylenol, Excedrin Migraine.  She states she tried a ginger tea which gave her about an hour long relief, however headache returned.  Denies any numbness or weakness, vision changes, vomiting, gait instability, difficulty speaking.  Not on anticoagulation.   The history is provided by the patient and medical records.       History reviewed. No pertinent past medical history.  There are no problems to display for this patient.   Past Surgical History:  Procedure Laterality Date  . THYROIDECTOMY, PARTIAL  2011     OB History    Gravida  2   Para  2   Term  0   Preterm  2   AB  0   Living  2     SAB  0   IAB  0   Ectopic  0   Multiple  0   Live Births  2           Family History  Problem Relation Age of Onset  . Hypertension Mother   . Hypertension Father     Social History   Tobacco Use  . Smoking status: Former Smoker    Quit date: 03/09/2015    Years since quitting: 5.7  . Smokeless tobacco: Never Used  Substance Use Topics   . Alcohol use: Yes    Comment: soc  . Drug use: No    Home Medications Prior to Admission medications   Medication Sig Start Date End Date Taking? Authorizing Provider  acetaminophen (TYLENOL) 500 MG tablet Take 1,000 mg by mouth every 6 (six) hours as needed for headache.   Yes [provider]  Aspirin-Salicylamide-Caffeine (BC HEADACHE PO) Take 1 packet by mouth daily as needed (headache).   Yes [provider]  ciprofloxacin (CIPRO) 500 MG tablet Take 1 tablet (500 mg total) by mouth 2 (two) times daily. Patient not taking: No sig reported 03/12/20   Wynetta Fines, MD  diclofenac (VOLTAREN) 50 MG EC tablet Take 1 tablet (50 mg total) by mouth 2 (two) times daily. Patient not taking: No sig reported 11/13/19   Linus Mako B, NP  diclofenac (VOLTAREN) 75 MG EC tablet Take 1 tablet (75 mg total) by mouth 2 (two) times daily. Patient not taking: No sig reported 11/26/19   Lenn Sink, DPM  gabapentin (NEURONTIN) 300 MG capsule Take 1 capsule (300 mg total) by mouth 3 (three) times daily. Patient not taking: No sig reported 12/31/19   Lenn Sink, DPM  metoCLOPramide (REGLAN) 10 MG tablet Take 1 tablet (10 mg total)  by mouth every 6 (six) hours as needed for nausea (headache). 12/18/20   Ivar Domangue, [place] N, PA-C  metroNIDAZOLE (FLAGYL) 500 MG tablet Take 1 tablet (500 mg total) by mouth 2 (two) times daily. Patient not taking: No sig reported 03/12/20   Wynetta Fines, MD  ondansetron (ZOFRAN) 4 MG tablet Take 1 tablet (4 mg total) by mouth every 6 (six) hours. Patient not taking: No sig reported 03/12/20   Wynetta Fines, MD    Allergies    Patient has no known allergies.  Review of Systems   Review of Systems  Eyes: Positive for photophobia.  Gastrointestinal: Positive for nausea.  Neurological: Positive for headaches.  All other systems reviewed and are negative.   Physical Exam Updated Vital Signs BP 104/64   Pulse 86   Temp 99.1 F (37.3 C)  (Oral)   Resp 16   Ht 5\' 3"  (1.6 m)   Wt 57.2 kg   LMP 12/07/2020   SpO2 100%   BMI 22.33 kg/m   Physical Exam Vitals and nursing note reviewed.  Constitutional:      Appearance: She is well-developed and well-nourished.  HENT:     Head: Normocephalic and atraumatic.  Eyes:     Conjunctiva/sclera: Conjunctivae normal.  Cardiovascular:     Rate and Rhythm: Normal rate and regular rhythm.  Pulmonary:     Effort: Pulmonary effort is normal.     Breath sounds: Normal breath sounds.  Abdominal:     Palpations: Abdomen is soft.  Skin:    General: Skin is warm.  Neurological:     Mental Status: She is alert.     Comments: Mental Status:  Alert, oriented, thought content appropriate, able to give a coherent history. Speech fluent without evidence of aphasia. Able to follow 2 step commands without difficulty.  Cranial Nerves:  II:  pupils equal, round, reactive to light III,IV, VI: ptosis not present, extra-ocular motions intact bilaterally  V,VII: smile symmetric, facial light touch sensation equal VIII: hearing grossly normal to voice  X: uvula elevates symmetrically  XI: bilateral shoulder shrug symmetric and strong XII: midline tongue extension without fassiculations Motor:  Normal tone. 5/5 strength in upper and lower extremities bilaterally including strong and equal grip strength and dorsiflexion/plantar flexion Sensory: grossly normal in all extremities.  Cerebellar: normal finger-to-nose with bilateral upper extremities Gait: normal gait and balance CV: distal pulses palpable throughout    Psychiatric:        Mood and Affect: Mood and affect normal.        Behavior: Behavior normal.     ED Results / Procedures / Treatments   Labs (all labs ordered are listed, but only abnormal results are displayed) Labs Reviewed  POC URINE PREG, ED    EKG None  Radiology No results found.  Procedures Procedures (including critical care time)  Medications Ordered in  ED Medications  sodium chloride 0.9 % bolus 1,000 mL (0 mLs Intravenous Stopped 12/18/20 2040)  metoCLOPramide (REGLAN) injection 10 mg (10 mg Intravenous Given 12/18/20 1909)  diphenhydrAMINE (BENADRYL) injection 25 mg (25 mg Intravenous Given 12/18/20 1908)  ketorolac (TORADOL) 30 MG/ML injection 30 mg (30 mg Intravenous Given 12/18/20 2115)  magnesium sulfate IVPB 2 g 50 mL (0 g Intravenous Stopped 12/18/20 2158)  dexamethasone (DECADRON) injection 8 mg (8 mg Intravenous Given 12/18/20 2116)    ED Course  I have reviewed the triage vital signs and the nursing notes.  Pertinent labs & imaging results that were available  during my care of the patient were reviewed by me and considered in my medical decision making (see chart for details).    MDM Rules/Calculators/A&P                          Patient presenting for persistent headache after minor head injury 6 days ago.  She had negative CT imaging during ED work-up on 12/13/2020.  She was discharged with concussion precautions and referral to concussion clinic.  She states her headache is been persisting, waxing and waning, gradually worsening.  She is associated photophobia and nausea without vomiting.  OTC medications have not been providing adequate relief.  On examination, she has no focal neuro deficits.  Vital signs are stable.  She is treated with migraine cocktail for symptom relief.  Patient was discussed with attending Dr. Dalene Seltzer.  Patient treated with migraine cocktail with improvement in symptoms.  Discussed reassuring evaluation.  Considering negative recent head CT and improvement in headache, likely symptoms of concussion.  Do not believe repeat head CT is indicated at this time.  Attending Dr. Dalene Seltzer is in agreement with care plan for discharge and outpatient follow-up.  Patient was provided with referral to concussion clinic during last ED visit, encouraged to schedule appointment. Strict return precautions  discussed.  Discussed results, findings, treatment and follow up. Patient advised of return precautions. Patient verbalized understanding and agreed with plan.  Final Clinical Impression(s) / ED Diagnoses Final diagnoses:  Bad headache  Concussion without loss of consciousness, subsequent encounter    Rx / DC Orders ED Discharge Orders         Ordered    metoCLOPramide (REGLAN) 10 MG tablet  Every 6 hours PRN        12/18/20 2302           Brenda Cowher, [place] N, PA-C 12/18/20 2304    Alvira Monday, MD 12/19/20 2322

## 2020-12-18 NOTE — ED Notes (Signed)
D/c instructions reviewed with pt. Pt verbalized understanding and states she is driving herself home. Pt states she is ok to drive home. Pt has no questions at this time.

## 2020-12-29 DIAGNOSIS — Z20822 Contact with and (suspected) exposure to covid-19: Secondary | ICD-10-CM | POA: Diagnosis not present

## 2021-01-20 ENCOUNTER — Encounter (HOSPITAL_COMMUNITY): Payer: Self-pay

## 2021-01-20 ENCOUNTER — Other Ambulatory Visit: Payer: Self-pay

## 2021-01-20 ENCOUNTER — Ambulatory Visit (HOSPITAL_COMMUNITY)
Admission: EM | Admit: 2021-01-20 | Discharge: 2021-01-20 | Disposition: A | Discharge status: Home / Self Care | Payer: BLUE CROSS/BLUE SHIELD | Attending: Student | Admitting: Student

## 2021-01-20 DIAGNOSIS — E890 Postprocedural hypothyroidism: Secondary | ICD-10-CM | POA: Diagnosis present

## 2021-01-20 DIAGNOSIS — E0789 Other specified disorders of thyroid: Secondary | ICD-10-CM | POA: Insufficient documentation

## 2021-01-20 LAB — TSH: TSH: 0.976 u[IU]/mL (ref 0.350–4.500)

## 2021-01-20 NOTE — Discharge Instructions (Addendum)
-  Please follow-up with PCP next week for further evaluation of thyroid pain

## 2021-01-20 NOTE — ED Triage Notes (Signed)
Pt present throat pain, symptom started yesterday. Pt states that its not an sore throat it something to do with her thyroid. She had one side removed and now the other side is giving her issues with swallowing.

## 2021-01-20 NOTE — ED Provider Notes (Addendum)
[facility]    CSN: [id number] Arrival date & time: 01/20/21  1338      History   Chief Complaint Chief Complaint  Patient presents with  . Sore Throat    HPI Wendy Greene is a 35 y.o. female presenting for right-sided sore throat. History partial thyroidectomy 2011 due to painful goiter. Was evaluated for right-sided thyroid pain and swelling 3 months ago by endocrinology. Today presents for 1 day of symptom exacerbation. Feels similar to how her thyroid felt in 2011. Endorses right-sided pain to the touch, mild swelling. States it's mildly painful to swallow. Called her PCP who told her she needed strep test, and so she is here today.    08/2010 Endocrinology note from [facility]-  Marshella Tello reports she had a partial thyroidectomy in 2011 due to a visible goiter that causes her to have pain. She reports having a biopsy which was benign and subsequently she proceeded with partial left thyroidectomy [facility] in [place]). She reports never starting levothyroxine thereafter. She reports she was not notified of any malignant results after surgery.  Since then she reports doing well overall but recently noticed new symptoms. She does endorse some pain in her right anterior neck and occasional difficulty swallowing solids such as hamburgers and [place] fries but not smaller more moist foods. She reports fatigue, headaches, and SOB on exertion, abd pain, nausea, heat intolerance, chest pains (went to ED and told everything was fine), regular menstruation for the most part (had 1 cycle come early), and reports chronic back pain and tends to bruise easily on 14 point review of systems. She self reports anxiety, palpitations, loose stools but denies tremors and reports stable weight   Patient reports a family history of thyroid issues in sister, thyroid cancer diagnosed around 22 status post thyroidectomy currently on medication. Pathology type unknown.    Patient denies personal history of head or neck radiation.  Patient reports occasional dysphagia but denies other compressive symptoms, including swelling of anterior neck and hoarseness of voice.      HPI  History reviewed. No pertinent past medical history.  There are no problems to display for this patient.   Past Surgical History:  Procedure Laterality Date  . THYROIDECTOMY, PARTIAL  2011    OB History    Gravida  2   Para  2   Term  0   Preterm  2   AB  0   Living  2     SAB  0   IAB  0   Ectopic  0   Multiple  0   Live Births  2            Home Medications    Prior to Admission medications   Medication Sig Start Date End Date Taking? Authorizing Provider  acetaminophen (TYLENOL) 500 MG tablet Take 1,000 mg by mouth every 6 (six) hours as needed for headache.    [provider]  Aspirin-Salicylamide-Caffeine (BC HEADACHE PO) Take 1 packet by mouth daily as needed (headache).    [provider]  ciprofloxacin (CIPRO) 500 MG tablet Take 1 tablet (500 mg total) by mouth 2 (two) times daily. Patient not taking: No sig reported 03/12/20   Wynetta Fines, MD  diclofenac (VOLTAREN) 50 MG EC tablet Take 1 tablet (50 mg total) by mouth 2 (two) times daily. Patient not taking: No sig reported 11/13/19   Linus Mako B, NP  diclofenac (VOLTAREN) 75 MG EC tablet  Take 1 tablet (75 mg total) by mouth 2 (two) times daily. Patient not taking: No sig reported 11/26/19   Lenn Sink, DPM  gabapentin (NEURONTIN) 300 MG capsule Take 1 capsule (300 mg total) by mouth 3 (three) times daily. Patient not taking: No sig reported 12/31/19   Lenn Sink, DPM  metoCLOPramide (REGLAN) 10 MG tablet Take 1 tablet (10 mg total) by mouth every 6 (six) hours as needed for nausea (headache). 12/18/20   Robinson, [place] N, PA-C  metroNIDAZOLE (FLAGYL) 500 MG tablet Take 1 tablet (500 mg total) by mouth 2 (two) times daily. Patient not taking: No sig  reported 03/12/20   Wynetta Fines, MD  ondansetron (ZOFRAN) 4 MG tablet Take 1 tablet (4 mg total) by mouth every 6 (six) hours. Patient not taking: No sig reported 03/12/20   Wynetta Fines, MD    Family History Family History  Problem Relation Age of Onset  . Hypertension Mother   . Hypertension Father     Social History Social History   Tobacco Use  . Smoking status: Former Smoker    Quit date: 03/09/2015    Years since quitting: 5.8  . Smokeless tobacco: Never Used  Substance Use Topics  . Alcohol use: Yes    Comment: soc  . Drug use: No     Allergies   Patient has no known allergies.   Review of Systems Review of Systems  All other systems reviewed and are negative.    Physical Exam Triage Vital Signs ED Triage Vitals  Enc Vitals Group     BP 01/20/21 1400 (!) 146/87     Pulse Rate 01/20/21 1400 88     Resp 01/20/21 1400 16     Temp 01/20/21 1400 98.6 F (37 C)     Temp Source 01/20/21 1400 Oral     SpO2 01/20/21 1400 98 %     Weight --      Height --      Head Circumference --      Peak Flow --      Pain Score 01/20/21 1401 8     Pain Loc --      Pain Edu? --      Excl. in GC? --    No data found.  Updated Vital Signs BP (!) 146/87 (BP Location: Right Arm)   Pulse 88   Temp 98.6 F (37 C) (Oral)   Resp 16   LMP 01/03/2021   SpO2 98%   Visual Acuity Right Eye Distance:   Left Eye Distance:   Bilateral Distance:    Right Eye Near:   Left Eye Near:    Bilateral Near:     Physical Exam Vitals reviewed.  Constitutional:      General: She is not in acute distress.    Appearance: Normal appearance. She is not ill-appearing.  HENT:     Head: Normocephalic and atraumatic.     Right Ear: Hearing, tympanic membrane, ear canal and external ear normal. No swelling or tenderness. There is no impacted cerumen. No mastoid tenderness. Tympanic membrane is not perforated, erythematous, retracted or bulging.     Left Ear: Hearing, tympanic  membrane, ear canal and external ear normal. No swelling or tenderness. There is no impacted cerumen. No mastoid tenderness. Tympanic membrane is not perforated, erythematous, retracted or bulging.     Nose:     Right Sinus: No maxillary sinus tenderness or frontal sinus tenderness.     Left  Sinus: No maxillary sinus tenderness or frontal sinus tenderness.     Mouth/Throat:     Mouth: Mucous membranes are moist.     Pharynx: Uvula midline. No oropharyngeal exudate or posterior oropharyngeal erythema.     Tonsils: No tonsillar exudate. 1+ on the right. 1+ on the left.  Neck:     Thyroid: Thyromegaly and thyroid tenderness present.     Comments: Right-sided thyromegaly and tenderness  Uvula midline, palate raises symmetrically  Cardiovascular:     Rate and Rhythm: Normal rate and regular rhythm.     Heart sounds: Normal heart sounds.  Pulmonary:     Breath sounds: Normal breath sounds and air entry. No wheezing, rhonchi or rales.  Chest:     Chest wall: No tenderness.  Abdominal:     General: Abdomen is flat. Bowel sounds are normal.     Tenderness: There is no abdominal tenderness. There is no guarding or rebound.  Lymphadenopathy:     Cervical: No cervical adenopathy.  Neurological:     General: No focal deficit present.     Mental Status: She is alert and oriented to person, place, and time.  Psychiatric:        Attention and Perception: Attention and perception normal.        Mood and Affect: Mood and affect normal.        Behavior: Behavior normal. Behavior is cooperative.        Thought Content: Thought content normal.        Judgment: Judgment normal.      UC Treatments / Results  Labs (all labs ordered are listed, but only abnormal results are displayed) Labs Reviewed  TSH    EKG   Radiology No results found.  Procedures Procedures (including critical care time)  Medications Ordered in UC Medications - No data to display  Initial Impression / Assessment  and Plan / UC Course  I have reviewed the triage vital signs and the nursing notes.  Pertinent labs & imaging results that were available during my care of the patient were reviewed by me and considered in my medical decision making (see chart for details).     Pt with history thyroid goiter s/p thyroidectomy 2011. She was evaluated by endo for right-sided thyroid pain and swelling 3 months ago (08/2020)- see their note above. She is presenting today for 1 day exacerbation of symptoms.  Pt presenting with thyroid tenderness for 1 day. centor score 1; patient declines strep test, covid test.   TSH drawn today.  Please follow-up with PCP and endo.   Final Clinical Impressions(s) / UC Diagnoses   Final diagnoses:  Thyroid pain     Discharge Instructions     -Please follow-up with PCP next week for further evaluation of thyroid pain    ED Prescriptions    None     PDMP not reviewed this encounter.   Rhys Martini, PA-C 01/20/21 1507    Rhys Martini, PA-C 01/20/21 1512

## 2021-10-26 ENCOUNTER — Other Ambulatory Visit: Payer: Self-pay

## 2021-10-26 ENCOUNTER — Encounter (HOSPITAL_BASED_OUTPATIENT_CLINIC_OR_DEPARTMENT_OTHER): Payer: Self-pay

## 2021-10-26 ENCOUNTER — Emergency Department (HOSPITAL_BASED_OUTPATIENT_CLINIC_OR_DEPARTMENT_OTHER)
Admission: EM | Admit: 2021-10-26 | Discharge: 2021-10-26 | Disposition: A | Discharge status: Home / Self Care | Payer: BLUE CROSS/BLUE SHIELD | Attending: Emergency Medicine | Admitting: Emergency Medicine

## 2021-10-26 DIAGNOSIS — Z87891 Personal history of nicotine dependence: Secondary | ICD-10-CM | POA: Diagnosis not present

## 2021-10-26 DIAGNOSIS — E876 Hypokalemia: Secondary | ICD-10-CM | POA: Diagnosis not present

## 2021-10-26 DIAGNOSIS — R1084 Generalized abdominal pain: Secondary | ICD-10-CM | POA: Insufficient documentation

## 2021-10-26 DIAGNOSIS — R1030 Lower abdominal pain, unspecified: Secondary | ICD-10-CM | POA: Diagnosis present

## 2021-10-26 LAB — PREGNANCY, URINE: Preg Test, Ur: NEGATIVE

## 2021-10-26 LAB — COMPREHENSIVE METABOLIC PANEL
ALT: 10 U/L (ref 0–44)
AST: 16 U/L (ref 15–41)
Albumin: 4.4 g/dL (ref 3.5–5.0)
Alkaline Phosphatase: 51 U/L (ref 38–126)
Anion gap: 10 (ref 5–15)
BUN: 9 mg/dL (ref 6–20)
CO2: 26 mmol/L (ref 22–32)
Calcium: 9.1 mg/dL (ref 8.9–10.3)
Chloride: 101 mmol/L (ref 98–111)
Creatinine, Ser: 0.89 mg/dL (ref 0.44–1.00)
GFR, Estimated: >60 mL/min (ref >60)
Glucose, Bld: 72 mg/dL (ref 70–99)
Potassium: 3.2 mmol/L — ABNORMAL LOW (ref 3.5–5.1)
Sodium: 137 mmol/L (ref 135–145)
Total Bilirubin: 0.7 mg/dL (ref 0.3–1.2)
Total Protein: 8 g/dL (ref 6.5–8.1)

## 2021-10-26 LAB — URINALYSIS, ROUTINE W REFLEX MICROSCOPIC
Bilirubin Urine: NEGATIVE
Glucose, UA: NEGATIVE mg/dL
Hgb urine dipstick: NEGATIVE
Ketones, ur: 40 mg/dL — AB
Leukocytes,Ua: NEGATIVE
Nitrite: NEGATIVE
Protein, ur: NEGATIVE mg/dL
Specific Gravity, Urine: 1.016 (ref 1.005–1.030)
pH: 6.5 (ref 5.0–8.0)

## 2021-10-26 LAB — CBC WITH DIFFERENTIAL/PLATELET
Abs Immature Granulocytes: 0.03 10*3/uL (ref 0.00–0.07)
Basophils Absolute: 0 10*3/uL (ref 0.0–0.1)
Basophils Relative: 0 %
Eosinophils Absolute: 0.1 10*3/uL (ref 0.0–0.5)
Eosinophils Relative: 2 %
HCT: 41.5 % (ref 36.0–46.0)
Hemoglobin: 13.6 g/dL (ref 12.0–15.0)
Immature Granulocytes: 0 %
Lymphocytes Relative: 23 %
Lymphs Abs: 1.9 10*3/uL (ref 0.7–4.0)
MCH: 27.9 pg (ref 26.0–34.0)
MCHC: 32.8 g/dL (ref 30.0–36.0)
MCV: 85 fL (ref 80.0–100.0)
Monocytes Absolute: 0.7 10*3/uL (ref 0.1–1.0)
Monocytes Relative: 8 %
Neutro Abs: 5.6 10*3/uL (ref 1.7–7.7)
Neutrophils Relative %: 67 %
Platelets: 253 10*3/uL (ref 150–400)
RBC: 4.88 MIL/uL (ref 3.87–5.11)
RDW: 12.5 % (ref 11.5–15.5)
WBC: 8.5 10*3/uL (ref 4.0–10.5)
nRBC: 0 % (ref 0.0–0.2)

## 2021-10-26 LAB — LIPASE, BLOOD: Lipase: 32 U/L (ref 11–51)

## 2021-10-26 MED ORDER — FAMOTIDINE 20 MG PO TABS: 20.0000 mg | ORAL_TABLET | Freq: Two times a day (BID) | ORAL | 0 refills | Status: AC

## 2021-10-26 MED ORDER — DICYCLOMINE HCL 20 MG PO TABS: 20.0000 mg | ORAL_TABLET | Freq: Two times a day (BID) | ORAL | 0 refills | Status: DC | PRN

## 2021-10-26 MED ORDER — POTASSIUM CHLORIDE CRYS ER 20 MEQ PO TBCR
40.0000 meq | EXTENDED_RELEASE_TABLET | Freq: Once | ORAL | Status: AC
  Administered 2021-10-26: 40 meq via ORAL
  Filled 2021-10-26: qty 2

## 2021-10-26 MED ORDER — OMEPRAZOLE 20 MG PO CPDR: 20.0000 mg | DELAYED_RELEASE_CAPSULE | Freq: Every day | ORAL | 0 refills | Status: DC

## 2021-10-26 MED ORDER — SUCRALFATE 1 G PO TABS: 1.0000 g | ORAL_TABLET | Freq: Three times a day (TID) | ORAL | 1 refills | Status: DC

## 2021-10-26 NOTE — ED Notes (Signed)
D/c paperwork reviewed with pt, including prescriptions and f/u care. Pt verbalized understanding, no questions or concerns at time of d/c. Ambulatory to ED exit.  

## 2021-10-26 NOTE — ED Provider Notes (Signed)
[facility] Provider Note   CSN: [id number] Arrival date & time: 10/26/21  1532     History Chief Complaint  Patient presents with   Abdominal Pain    Wendy Greene is a 35 y.o. female.  Patient with history of diverticulitis, no abdominal surgery history presents to the emergency department for evaluation of abdominal pain.  Symptoms have been ongoing intermittently over the past 2 months but have been worse over the past week and more persistent.  Over the past couple of days it has been interfering with her job.  Patient states decreased oral intake as eating and drinking tends to make her pain worse.  She describes bloating and tightness in her upper abdomen.  Also reports a cramping pain in her lower abdomen.  No nausea, vomiting, diarrhea.  No blood in the stool.  No urinary symptoms or vaginal symptoms.  She has tried both a laxative and gas medication for symptoms and this has not really seem to help.  No fevers, chest pain or shortness of breath.  Patient had a colonoscopy and EGD September 2021 in the [facility] system.  She does not remember any significant findings.  I can see in epic that her pathology for H. pylori and eosinophilic esophagitis were negative.  She was started on Pepcid.  Symptoms sometimes feel like her previous episode of diverticulitis but sometimes not.      History reviewed. No pertinent past medical history.  There are no problems to display for this patient.   Past Surgical History:  Procedure Laterality Date   THYROIDECTOMY, PARTIAL  2011     OB History     Gravida  2   Para  2   Term  0   Preterm  2   AB  0   Living  2      SAB  0   IAB  0   Ectopic  0   Multiple  0   Live Births  2           Family History  Problem Relation Age of Onset   Hypertension Mother    Hypertension Father     Social History   Tobacco Use   Smoking status: Former    Types: Cigarettes    Quit date:  03/09/2015    Years since quitting: 6.6   Smokeless tobacco: Never  Vaping Use   Vaping Use: Every day  Substance Use Topics   Alcohol use: Yes    Comment: soc   Drug use: No    Home Medications Prior to Admission medications   Medication Sig Start Date End Date Taking? Authorizing Provider  ciprofloxacin (CIPRO) 500 MG tablet Take 1 tablet (500 mg total) by mouth 2 (two) times daily. Patient not taking: No sig reported 03/12/20   Valarie Merino, MD  diclofenac (VOLTAREN) 50 MG EC tablet Take 1 tablet (50 mg total) by mouth 2 (two) times daily. Patient not taking: No sig reported 11/13/19   Augusto Gamble B, NP  diclofenac (VOLTAREN) 75 MG EC tablet Take 1 tablet (75 mg total) by mouth 2 (two) times daily. Patient not taking: No sig reported 11/26/19   Wallene Huh, DPM  gabapentin (NEURONTIN) 300 MG capsule Take 1 capsule (300 mg total) by mouth 3 (three) times daily. Patient not taking: No sig reported 12/31/19   Wallene Huh, DPM  metoCLOPramide (REGLAN) 10 MG tablet Take 1 tablet (10 mg total) by mouth every 6 (six) hours  as needed for nausea (headache). Patient not taking: No sig reported 12/18/20   Robinson, [place] N, PA-C  metroNIDAZOLE (FLAGYL) 500 MG tablet Take 1 tablet (500 mg total) by mouth 2 (two) times daily. Patient not taking: No sig reported 03/12/20   Wynetta Fines, MD  ondansetron (ZOFRAN) 4 MG tablet Take 1 tablet (4 mg total) by mouth every 6 (six) hours. Patient not taking: No sig reported 03/12/20   Wynetta Fines, MD    Allergies    Patient has no known allergies.  Review of Systems   Review of Systems  Constitutional:  Negative for fever.  HENT:  Negative for rhinorrhea and sore throat.   Eyes:  Negative for redness.  Respiratory:  Negative for cough.   Cardiovascular:  Negative for chest pain.  Gastrointestinal:  Positive for abdominal pain. Negative for diarrhea, nausea and vomiting.  Genitourinary:  Negative for dysuria, frequency,  hematuria, urgency, vaginal bleeding and vaginal discharge.  Musculoskeletal:  Negative for myalgias.  Skin:  Negative for rash.  Neurological:  Negative for headaches.   Physical Exam Updated Vital Signs BP 122/69 (BP Location: Right Arm)   Pulse 100   Temp 98.5 F (36.9 C) (Oral)   Resp 16   Ht 5\' 3"  (1.6 m)   Wt 60.3 kg   LMP 10/04/2021 (Exact Date)   SpO2 100%   BMI 23.56 kg/m   Physical Exam Vitals and nursing note reviewed.  Constitutional:      General: She is not in acute distress.    Appearance: She is well-developed.  HENT:     Head: Normocephalic and atraumatic.     Right Ear: External ear normal.     Left Ear: External ear normal.     Nose: Nose normal.  Eyes:     Conjunctiva/sclera: Conjunctivae normal.  Cardiovascular:     Rate and Rhythm: Normal rate and regular rhythm.     Heart sounds: No murmur heard. Pulmonary:     Effort: No respiratory distress.     Breath sounds: No wheezing, rhonchi or rales.  Abdominal:     Palpations: Abdomen is soft.     Tenderness: There is generalized abdominal tenderness (Mild generalized tenderness without rebound or guarding). There is no guarding or rebound. Negative signs include Murphy's sign and McBurney's sign.  Musculoskeletal:     Cervical back: Normal range of motion and neck supple.     Right lower leg: No edema.     Left lower leg: No edema.  Skin:    General: Skin is warm and dry.     Findings: No rash.  Neurological:     General: No focal deficit present.     Mental Status: She is alert. Mental status is at baseline.     Motor: No weakness.  Psychiatric:        Mood and Affect: Mood normal.    ED Results / Procedures / Treatments   Labs (all labs ordered are listed, but only abnormal results are displayed) Labs Reviewed  COMPREHENSIVE METABOLIC PANEL - Abnormal; Notable for the following components:      Result Value   Potassium 3.2 (*)    All other components within normal limits  URINALYSIS,  ROUTINE W REFLEX MICROSCOPIC - Abnormal; Notable for the following components:   Ketones, ur 40 (*)    All other components within normal limits  CBC WITH DIFFERENTIAL/PLATELET  LIPASE, BLOOD  PREGNANCY, URINE    EKG None  Radiology No results found.  Procedures Procedures   Medications Ordered in ED Medications - No data to display  ED Course  I have reviewed the triage vital signs and the nursing notes.  Pertinent labs & imaging results that were available during my care of the patient were reviewed by me and considered in my medical decision making (see chart for details).  Patient seen and examined. Work-up initiated.   Vital signs reviewed and are as follows: BP 122/69 (BP Location: Right Arm)   Pulse 100   Temp 98.5 F (36.9 C) (Oral)   Resp 16   Ht 5\' 3"  (1.6 m)   Wt 60.3 kg   LMP 10/04/2021 (Exact Date)   SpO2 100%   BMI 23.56 kg/m   7:00 PM work-up reviewed with patient at bedside.  Plan for treatment of gastritis/PUD symptoms.  Prescription written for omeprazole, Pepcid, Carafate, Bentyl.  Encourage PCP/GI follow-up.  Patient in agreement with plan.  Her only significant lab abnormality was low potassium.  This was treated with potassium supplementation.  The patient was urged to return to the Emergency Department immediately with worsening of current symptoms, worsening abdominal pain, persistent vomiting, blood noted in stools, fever, or any other concerns. The patient verbalized understanding.     MDM Rules/Calculators/A&P                           Patient with abdominal pain, epigastric, exacerbated by eating.  Vitals are stable, no fever. Labs reassuring, low potassium repleted. Imaging not felt indicated. No signs of dehydration, patient is tolerating PO's.  Will give empiric treatment for PUD/gastritis.  Lungs are clear and no signs suggestive of PNA. Low concern for appendicitis, cholecystitis, pancreatitis, ruptured viscus, UTI, kidney stone,  aortic dissection, aortic aneurysm or other emergent abdominal etiology. Supportive therapy indicated with return if symptoms worsen.   Final Clinical Impression(s) / ED Diagnoses Final diagnoses:  Generalized abdominal pain  Hypokalemia    Rx / DC Orders ED Discharge Orders     None        Carlisle Cater, PA-C 10/26/21 1902    Hayden Rasmussen, MD 10/27/21 [phone]

## 2021-10-26 NOTE — Discharge Instructions (Signed)
Please read and follow all provided instructions.  Your diagnoses today include:  1. Generalized abdominal pain   2. Hypokalemia     Tests performed today include: Blood cell counts and platelets: low potassium, otherwise normal Kidney and liver function tests Pancreas function test (called lipase) Urine test to look for infection A blood or urine test for pregnancy (women only) Vital signs. See below for your results today.   Medications prescribed:  Omeprazole (Prilosec) - stomach acid reducer  This medication can be found over-the-counter  Pepcid (famotidine) - antihistamine  You can find this medication over-the-counter.   DO NOT exceed:  20mg  Pepcid every 12 hours  Carafate - for stomach upset and to protect your stomach  Bentyl - medication for intestinal cramps and spasms  Take any prescribed medications only as directed.  Home care instructions:  Follow any educational materials contained in this packet.  Follow-up instructions: Please follow-up with your primary care provider in the next 7 days for further evaluation of your symptoms.    Return instructions:  SEEK IMMEDIATE MEDICAL ATTENTION IF: The pain does not go away or becomes severe  A temperature above 101F develops  Repeated vomiting occurs (multiple episodes)  The pain becomes localized to portions of the abdomen. The right side could possibly be appendicitis. In an adult, the left lower portion of the abdomen could be colitis or diverticulitis.  Blood is being passed in stools or vomit (bright red or black tarry stools)  You develop chest pain, difficulty breathing, dizziness or fainting, or become confused, poorly responsive, or inconsolable (young children) If you have any other emergent concerns regarding your health  Additional Information: Abdominal (belly) pain can be caused by many things. Your caregiver performed an examination and possibly ordered blood/urine tests and imaging (CT scan,  x-rays, ultrasound). Many cases can be observed and treated at home after initial evaluation in the emergency department. Even though you are being discharged home, abdominal pain can be unpredictable. Therefore, you need a repeated exam if your pain does not resolve, returns, or worsens. Most patients with abdominal pain don't have to be admitted to the hospital or have surgery, but serious problems like appendicitis and gallbladder attacks can start out as nonspecific pain. Many abdominal conditions cannot be diagnosed in one visit, so follow-up evaluations are very important.  Your vital signs today were: BP 121/66 (BP Location: Right Arm)   Pulse 88   Temp 98.5 F (36.9 C) (Oral)   Resp 18   Ht 5\' 3"  (1.6 m)   Wt 60.3 kg   LMP 10/04/2021 (Exact Date)   SpO2 100%   BMI 23.56 kg/m  If your blood pressure (bp) was elevated above 135/85 this visit, please have this repeated by your doctor within one month. --------------

## 2021-10-26 NOTE — ED Notes (Signed)
Attempted to get iv, pt states fluid intake has been poor since Monday,

## 2021-10-26 NOTE — ED Triage Notes (Signed)
Pt states she has had abdominal pain a few days, has taken laxative and gas-x with no relief. Denies n/v. After eating she becomes very bloated which increases her pain.

## 2021-12-12 ENCOUNTER — Ambulatory Visit: Payer: Self-pay

## 2021-12-12 NOTE — Telephone Encounter (Signed)
° °  Chief Complaint: Recurrent abdominal pain Symptoms: Pain Frequency: Started in November Pertinent Negatives: Patient denies vomiting, diarrhea Disposition: [] ED /[] Urgent Care (no appt availability in office) / [] Appointment(In office/virtual)/ []  [facility]/ [] Home Care/ [] Refused Recommended Disposition  Additional Notes: Instructed to call her PCP for follow up. Has run out of medications given in ED in November.   Reason for Disposition  [1] MILD pain (e.g., does not interfere with normal activities) AND [2] pain comes and goes (cramps) AND [3] present > 48 hours  (Exception: this same abdominal pain is a chronic symptom recurrent or ongoing AND present > 4 weeks)  Answer Assessment - Initial Assessment Questions 1. LOCATION: "Where does it hurt?"      Bellow belly button on left 2. RADIATION: "Does the pain shoot anywhere else?" (e.g., chest, back)     Low back 3. ONSET: "When did the pain begin?" (e.g., minutes, hours or days ago)      November 4. SUDDEN: "Gradual or sudden onset?"     Gradual 5. PATTERN "Does the pain come and go, or is it constant?"    - If constant: "Is it getting better, staying the same, or worsening?"      (Note: Constant means the pain never goes away completely; most serious pain is constant and it progresses)     - If intermittent: "How long does it last?" "Do you have pain now?"     (Note: Intermittent means the pain goes away completely between bouts)     Constant 6. SEVERITY: "How bad is the pain?"  (e.g., Scale 1-10; mild, moderate, or severe)   - MILD (1-3): doesn't interfere with normal activities, abdomen soft and not tender to touch    - MODERATE (4-7): interferes with normal activities or awakens from sleep, abdomen tender to touch    - SEVERE (8-10): excruciating pain, doubled over, unable to do any normal activities      Now - 5 7. RECURRENT SYMPTOM: "Have you ever had this type of stomach pain before?" If Yes, ask: "When was  the last time?" and "What happened that time?"      No 8. CAUSE: "What do you think is causing the stomach pain?"     Reflux 9. RELIEVING/AGGRAVATING FACTORS: "What makes it better or worse?" (e.g., movement, antacids, bowel movement)     Medication helps some 10. OTHER SYMPTOMS: "Do you have any other symptoms?" (e.g., back pain, diarrhea, fever, urination pain, vomiting)       No 11. PREGNANCY: "Is there any chance you are pregnant?" "When was your last menstrual period?"       No  Protocols used: Abdominal Pain - [facility]

## 2022-04-25 ENCOUNTER — Other Ambulatory Visit: Payer: Self-pay

## 2022-04-25 ENCOUNTER — Emergency Department (HOSPITAL_BASED_OUTPATIENT_CLINIC_OR_DEPARTMENT_OTHER)
Admission: EM | Admit: 2022-04-25 | Discharge: 2022-04-25 | Disposition: A | Discharge status: Home / Self Care | Payer: BLUE CROSS/BLUE SHIELD | Attending: Emergency Medicine | Admitting: Emergency Medicine

## 2022-04-25 ENCOUNTER — Emergency Department (HOSPITAL_BASED_OUTPATIENT_CLINIC_OR_DEPARTMENT_OTHER): Payer: BLUE CROSS/BLUE SHIELD

## 2022-04-25 ENCOUNTER — Encounter (HOSPITAL_BASED_OUTPATIENT_CLINIC_OR_DEPARTMENT_OTHER): Payer: Self-pay

## 2022-04-25 DIAGNOSIS — Z87891 Personal history of nicotine dependence: Secondary | ICD-10-CM | POA: Diagnosis not present

## 2022-04-25 DIAGNOSIS — R1084 Generalized abdominal pain: Secondary | ICD-10-CM | POA: Insufficient documentation

## 2022-04-25 DIAGNOSIS — E876 Hypokalemia: Secondary | ICD-10-CM | POA: Diagnosis not present

## 2022-04-25 DIAGNOSIS — R109 Unspecified abdominal pain: Secondary | ICD-10-CM | POA: Diagnosis present

## 2022-04-25 LAB — COMPREHENSIVE METABOLIC PANEL
ALT: 12 U/L (ref 0–44)
AST: 18 U/L (ref 15–41)
Albumin: 4.8 g/dL (ref 3.5–5.0)
Alkaline Phosphatase: 58 U/L (ref 38–126)
Anion gap: 11 (ref 5–15)
BUN: 10 mg/dL (ref 6–20)
CO2: 27 mmol/L (ref 22–32)
Calcium: 9.8 mg/dL (ref 8.9–10.3)
Chloride: 100 mmol/L (ref 98–111)
Creatinine, Ser: 0.77 mg/dL (ref 0.44–1.00)
GFR, Estimated: >60 mL/min (ref >60)
Glucose, Bld: 78 mg/dL (ref 70–99)
Potassium: 3.3 mmol/L — ABNORMAL LOW (ref 3.5–5.1)
Sodium: 138 mmol/L (ref 135–145)
Total Bilirubin: 0.6 mg/dL (ref 0.3–1.2)
Total Protein: 8.2 g/dL — ABNORMAL HIGH (ref 6.5–8.1)

## 2022-04-25 LAB — CBC WITH DIFFERENTIAL/PLATELET
Abs Immature Granulocytes: 0.02 10*3/uL (ref 0.00–0.07)
Basophils Absolute: 0 10*3/uL (ref 0.0–0.1)
Basophils Relative: 1 %
Eosinophils Absolute: 0.1 10*3/uL (ref 0.0–0.5)
Eosinophils Relative: 1 %
HCT: 42.8 % (ref 36.0–46.0)
Hemoglobin: 13.9 g/dL (ref 12.0–15.0)
Immature Granulocytes: 0 %
Lymphocytes Relative: 32 %
Lymphs Abs: 2.6 10*3/uL (ref 0.7–4.0)
MCH: 27.3 pg (ref 26.0–34.0)
MCHC: 32.5 g/dL (ref 30.0–36.0)
MCV: 84.1 fL (ref 80.0–100.0)
Monocytes Absolute: 0.7 10*3/uL (ref 0.1–1.0)
Monocytes Relative: 8 %
Neutro Abs: 4.7 10*3/uL (ref 1.7–7.7)
Neutrophils Relative %: 58 %
Platelets: 261 10*3/uL (ref 150–400)
RBC: 5.09 MIL/uL (ref 3.87–5.11)
RDW: 12.4 % (ref 11.5–15.5)
WBC: 8.1 10*3/uL (ref 4.0–10.5)
nRBC: 0 % (ref 0.0–0.2)

## 2022-04-25 LAB — LIPASE, BLOOD: Lipase: 50 U/L (ref 11–51)

## 2022-04-25 MED ORDER — DICYCLOMINE HCL 10 MG PO CAPS
10.0000 mg | ORAL_CAPSULE | Freq: Once | ORAL | Status: AC
Start: 2022-04-25 — End: 2022-04-25
  Administered 2022-04-25: 10 mg via ORAL
  Filled 2022-04-25: qty 1

## 2022-04-25 MED ORDER — PREDNISONE 10 MG PO TABS: 40.0000 mg | ORAL_TABLET | Freq: Every day | ORAL | 0 refills | Status: DC

## 2022-04-25 MED ORDER — KETOROLAC TROMETHAMINE 15 MG/ML IJ SOLN
15.0000 mg | Freq: Once | INTRAMUSCULAR | Status: AC
Start: 2022-04-25 — End: 2022-04-25
  Administered 2022-04-25: 15 mg via INTRAVENOUS
  Filled 2022-04-25: qty 1

## 2022-04-25 MED ORDER — IOHEXOL 300 MG/ML  SOLN
100.0000 mL | Freq: Once | INTRAMUSCULAR | Status: AC | PRN
  Administered 2022-04-25: 80 mL via INTRAVENOUS

## 2022-04-25 MED ORDER — IOHEXOL 9 MG/ML PO SOLN
500.0000 mL | Freq: Two times a day (BID) | ORAL | Status: DC | PRN
  Administered 2022-04-25 (×2): 500 mL via ORAL

## 2022-04-25 MED ORDER — POTASSIUM CHLORIDE CRYS ER 20 MEQ PO TBCR
20.0000 meq | EXTENDED_RELEASE_TABLET | Freq: Once | ORAL | Status: AC
  Administered 2022-04-25: 20 meq via ORAL
  Filled 2022-04-25: qty 1

## 2022-04-25 MED ORDER — DICYCLOMINE HCL 20 MG PO TABS: 20.0000 mg | ORAL_TABLET | Freq: Two times a day (BID) | ORAL | 0 refills | Status: DC

## 2022-04-25 NOTE — ED Triage Notes (Signed)
Patient here POV from Home with ABD Pain. ? ?Endorses Pain for Months and being evaluated several times in ED and by PCP. ? ?Pain is Intermittent and is associated with back Pain at Times. No Discernable Location. Associated with Nausea at Times as well as Fatigue.  ? ?NAD Noted during Triage. A&Ox4. GCS 15. Ambulatory. ?

## 2022-04-25 NOTE — ED Provider Notes (Signed)
?[facility] EMERGENCY DEPT ?Provider Note ? ? ?CSN: [id number] ?Arrival date & time: 04/25/22  1842 ? ?  ? ?History ?Chief Complaint  ?Patient presents with  ? Abdominal Pain  ? ? ?Wendy Greene is a 36 y.o. female with history of diverticulitis and chronic abdominal pain who presents the emergency department today with chronic abdominal pain has been ongoing for months but worse over the last week or so.  She states that her abdominal pain is gone so severe that is now impacting the way she walks and the way she performs her ADLs which prompted her arrival to the emergency department today.  She denies any nausea, vomiting, bowel changes, fever, chills, dysuria, urinary frequency, urinary urgency, or hematuria. ? ?Upon chart review, patient had a CT scan on [facility] in December which did not show evidence of diverticulitis at that time but did have some free fluid.  Patient was then referred to OB/GYN which she saw in March and had an ultrasound which still showed free fluid which is thought to be physiologic in nature. ? ? ?Abdominal Pain ? ?  ? ?Home Medications ?Prior to Admission medications   ?Medication Sig Start Date End Date Taking? Authorizing Provider  ?dicyclomine (BENTYL) 20 MG tablet Take 1 tablet (20 mg total) by mouth 2 (two) times daily. 04/25/22  Yes Meredeth Ide, Timothy Townsel M, PA-C  ?predniSONE (DELTASONE) 10 MG tablet Take 4 tablets (40 mg total) by mouth daily. 04/25/22  Yes Meredeth Ide, Alton Tremblay M, PA-C  ?famotidine (PEPCID) 20 MG tablet Take 1 tablet (20 mg total) by mouth 2 (two) times daily. 10/26/21   Renne Crigler, PA-C  ?omeprazole (PRILOSEC) 20 MG capsule Take 1 capsule (20 mg total) by mouth daily. 10/26/21   Renne Crigler, PA-C  ?sucralfate (CARAFATE) 1 g tablet Take 1 tablet (1 g total) by mouth 4 (four) times daily -  with meals and at bedtime. 10/26/21   Renne Crigler, PA-C  ?   ? ?Allergies    ?Patient has no known allergies.   ? ?Review of Systems   ?Review of Systems   ?Gastrointestinal:  Positive for abdominal pain.  ?All other systems reviewed and are negative. ? ?Physical Exam ?Updated Vital Signs ?BP 113/82 (BP Location: Right Arm)   Pulse 93   Temp 98.4 ?F (36.9 ?C) (Oral)   Resp 16   Ht 5\' 3"  (1.6 m)   Wt 60.3 kg   SpO2 100%   BMI 23.55 kg/m?  ?Physical Exam ?Vitals and nursing note reviewed.  ?Constitutional:   ?   General: She is not in acute distress. ?   Appearance: Normal appearance.  ?HENT:  ?   Head: Normocephalic and atraumatic.  ?Eyes:  ?   General:     ?   Right eye: No discharge.     ?   Left eye: No discharge.  ?Cardiovascular:  ?   Comments: Regular rate and rhythm.  S1/S2 are distinct without any evidence of murmur, rubs, or gallops.  Radial pulses are 2+ bilaterally.  Dorsalis pedis pulses are 2+ bilaterally.  No evidence of pedal edema. ?Pulmonary:  ?   Comments: Clear to auscultation bilaterally.  Normal effort.  No respiratory distress.  No evidence of wheezes, rales, or rhonchi heard throughout. ?Abdominal:  ?   General: Abdomen is flat. Bowel sounds are normal. There is no distension.  ?   Tenderness: There is generalized abdominal tenderness. There is no guarding or rebound.  ?Musculoskeletal:     ?  General: Normal range of motion.  ?   Cervical back: Neck supple.  ?Skin: ?   General: Skin is warm and dry.  ?   Findings: No rash.  ?Neurological:  ?   General: No focal deficit present.  ?   Mental Status: She is alert.  ?Psychiatric:     ?   Mood and Affect: Mood normal.     ?   Behavior: Behavior normal.  ? ? ?ED Results / Procedures / Treatments   ?Labs ?(all labs ordered are listed, but only abnormal results are displayed) ?Labs Reviewed  ?COMPREHENSIVE METABOLIC PANEL - Abnormal; Notable for the following components:  ?    Result Value  ? Potassium 3.3 (*)   ? Total Protein 8.2 (*)   ? All other components within normal limits  ?CBC WITH DIFFERENTIAL/PLATELET  ?LIPASE, BLOOD  ? ? ?EKG ?None ? ?Radiology ?CT ABDOMEN PELVIS W  CONTRAST ? ?Result Date: 04/25/2022 ?CLINICAL DATA:  Left upper quadrant pain nausea EXAM: CT ABDOMEN AND PELVIS WITH CONTRAST TECHNIQUE: Multidetector CT imaging of the abdomen and pelvis was performed using the standard protocol following bolus administration of intravenous contrast. RADIATION DOSE REDUCTION: This exam was performed according to the departmental dose-optimization program which includes automated exposure control, adjustment of the mA and/or kV according to patient size and/or use of iterative reconstruction technique. CONTRAST:  80mL OMNIPAQUE IOHEXOL 300 MG/ML  SOLN COMPARISON:  CT 03/12/2020 FINDINGS: Lower chest: No acute abnormality. Hepatobiliary: No focal liver abnormality is seen. No gallstones, gallbladder wall thickening, or biliary dilatation. Pancreas: Unremarkable. No pancreatic ductal dilatation or surrounding inflammatory changes. Spleen: Normal in size without focal abnormality. Adrenals/Urinary Tract: Adrenal glands are unremarkable. Kidneys are normal, without renal calculi, focal lesion, or hydronephrosis. Bladder is unremarkable. Stomach/Bowel: Stomach is nonenlarged. No dilated small bowel. Thickening of the terminal ileum. Mild wall thickening involves the ascending colon, hepatic flexure and proximal transverse colon. Negative appendix. Possible mild wall thickening at the distal transverse colon/splenic flexure. Vascular/Lymphatic: No significant vascular findings are present. No enlarged abdominal or pelvic lymph nodes. Reproductive: Uterus and bilateral adnexa are unremarkable. Other: Negative for free air. Small amount of free fluid in the pelvis. Musculoskeletal: No acute or significant osseous findings. IMPRESSION: 1. Patchy areas of slightly irregular bowel wall thickening involving the terminal ileum, ascending colon, hepatic flexure, transverse colon and splenic flexure suggestive of colitis, either due to infection or inflammatory bowel disease. 2. Small free fluid in  the pelvis. Electronically Signed   By: Jasmine PangKim  Fujinaga M.D.   On: 04/25/2022 22:01   ? ?Procedures ?Procedures  ? ? ?Medications Ordered in ED ?Medications  ?iohexol (OMNIPAQUE) 9 MG/ML oral solution 500 mL (500 mLs Oral Contrast Given 04/25/22 2034)  ?dicyclomine (BENTYL) capsule 10 mg (has no administration in time range)  ?ketorolac (TORADOL) 15 MG/ML injection 15 mg (15 mg Intravenous Given 04/25/22 2043)  ?iohexol (OMNIPAQUE) 300 MG/ML solution 100 mL (80 mLs Intravenous Contrast Given 04/25/22 2137)  ?potassium chloride SA (KLOR-CON M) CR tablet 20 mEq (20 mEq Oral Given 04/25/22 2203)  ? ? ?ED Course/ Medical Decision Making/ A&P ?  ?                        ?Medical Decision Making ?Amount and/or Complexity of Data Reviewed ?Labs: ordered. ?Radiology: ordered. ? ?Risk ?Prescription drug management. ? ? ?This patient presents to the ED for concern of chronic abdominal pain, this involves an extensive number of treatment options, and  is a complaint that carries with it a high risk of complications and morbidity.  The differential diagnosis includes diverticulitis, appendicitis, bowel obstruction, GERD. ? ? ?Co morbidities that complicate the patient evaluation ? ?None ? ? ?Additional history obtained: ? ?Additional history obtained from nursing note ? ? ?Lab Tests: ? ?I Ordered, and personally interpreted labs.  The pertinent results include: CBC shows no evidence of leukocytosis or anemia.  Lipase negative.  CMP shows mild hypokalemia.  This will be repleted in the emergency department.  ? ? ?Imaging Studies ordered: ? ?I ordered imaging studies including CT abdomen pelvis with contrast ?I independently visualized and interpreted imaging which showed possible colitis versus inflammatory bowel disease ?I agree with the radiologist interpretation ? ? ?Cardiac Monitoring: ? ?The patient was maintained on a cardiac monitor.  I personally viewed and interpreted the cardiac monitored which showed an underlying rhythm of:  Normal sinus rhythm ? ? ?Medicines ordered and prescription drug management: ? ?I ordered medication including Toradol for pain ?Reevaluation of the patient after these medicines showed that the patient improved ?I have reviewed the pa

## 2022-04-25 NOTE — Discharge Instructions (Signed)
You were seen and evaluated here today for abdominal pain.  Labs are all reassuring.  Imaging did reveal some inflammatory changes in your bowels.  This needs to be further evaluated by a gastroenterologist.  I given you 3-day course of steroids in addition to Bentyl for abdominal spasms.  Please return to the emergency department sooner if you experience any worsening symptoms. ?

## 2022-05-18 IMAGING — CT CT HEAD W/O CM
3 series · 15 of 47 positions shown, 18 images · non-contrast
Comparison: No pertinent prior exams available for comparison.

CLINICAL DATA: Headache. Additional history provided: Patient
reports hitting her head on a shelf 12/12/2020, headache and
photosensitivity.

EXAM:
CT HEAD WITHOUT CONTRAST
TECHNIQUE: Contiguous axial images were obtained from the base of the skull
through the vertex without intravenous contrast.

[Series 2: head wo · axial · 0.47mm/px · z∈[-141,-16]mm · 9 of 30 slices shown, 12 images]
[im 3/30  brain]
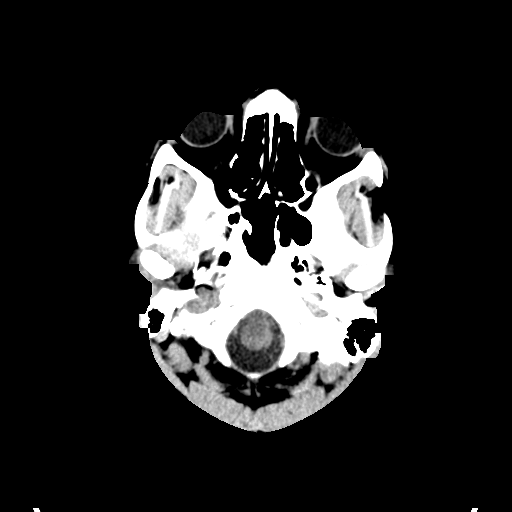
[im 3/30  bone]
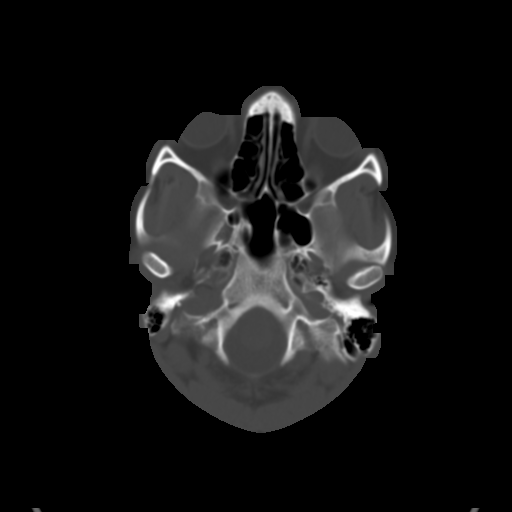
[im 6/30  brain]
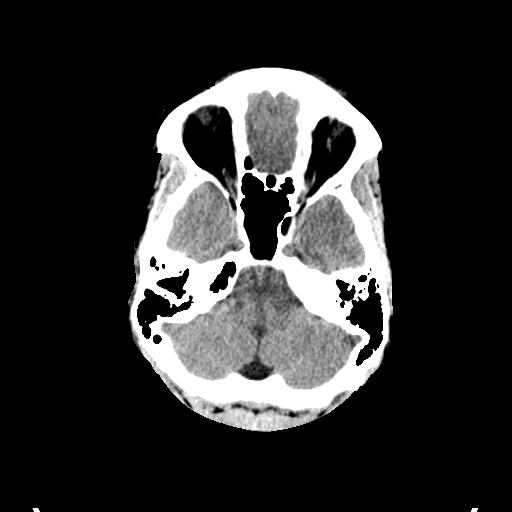
[im 9/30  brain]
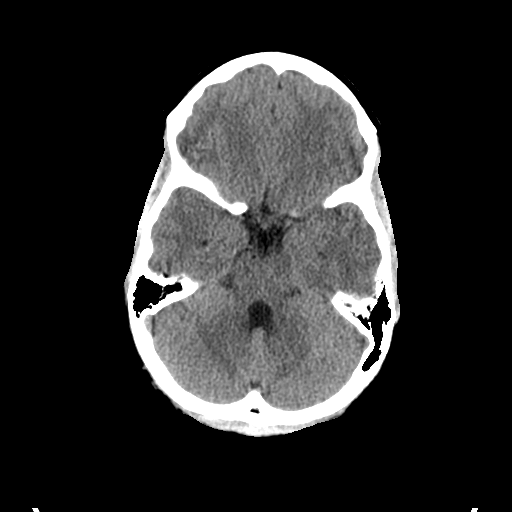
[im 12/30  brain]
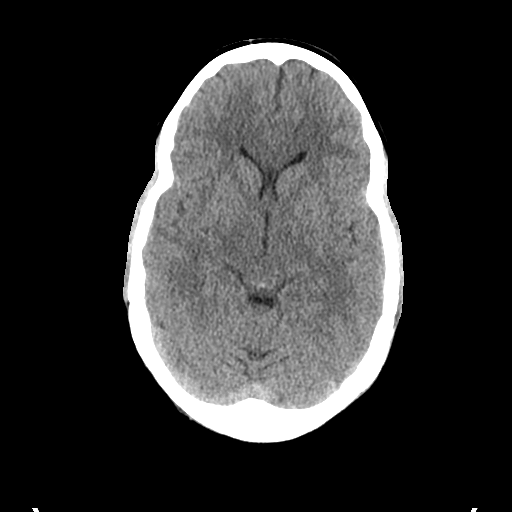
[im 16/30  brain]
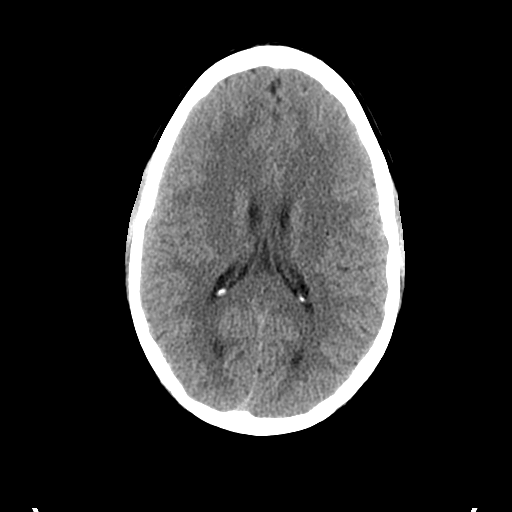
[im 16/30  bone]
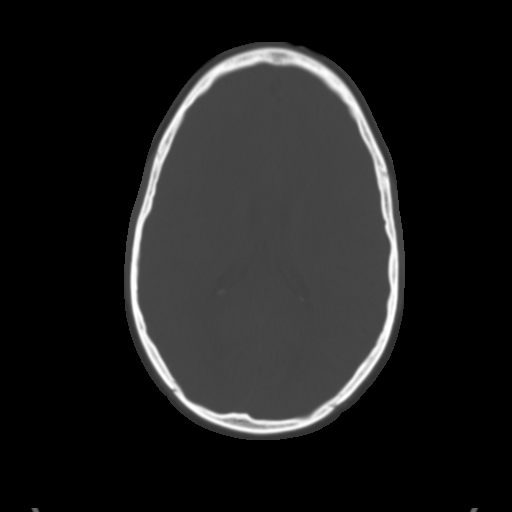
[im 19/30  brain]
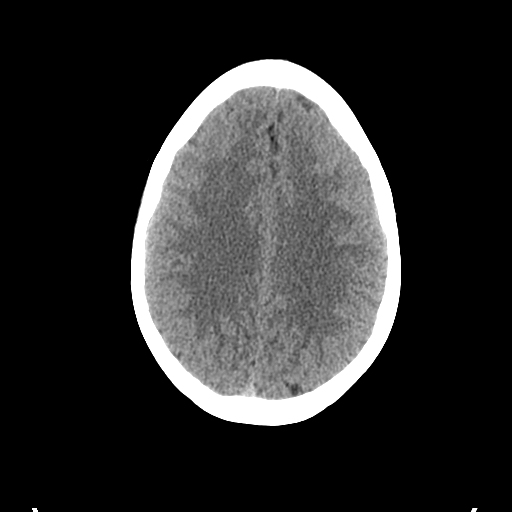
[im 22/30  brain]
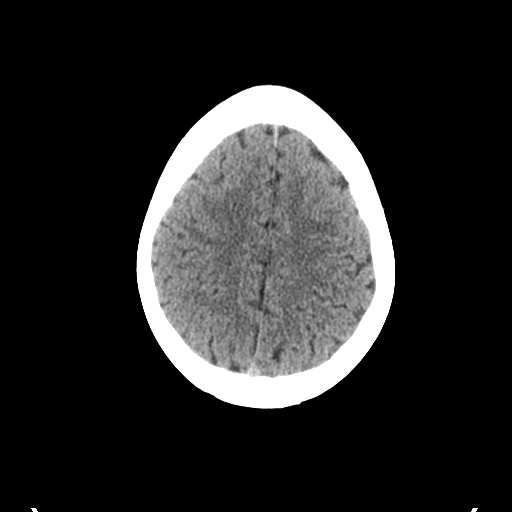
[im 25/30  brain]
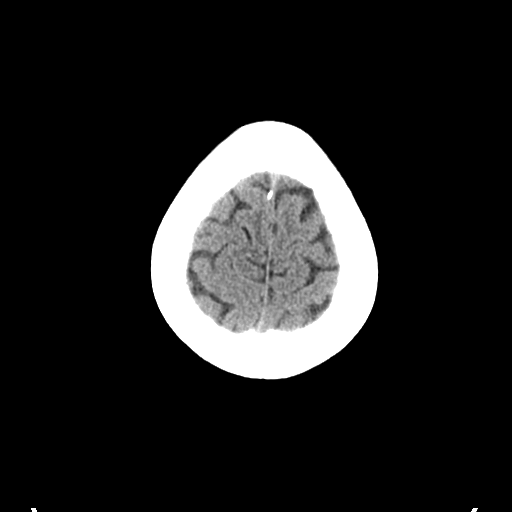
[im 28/30  brain]
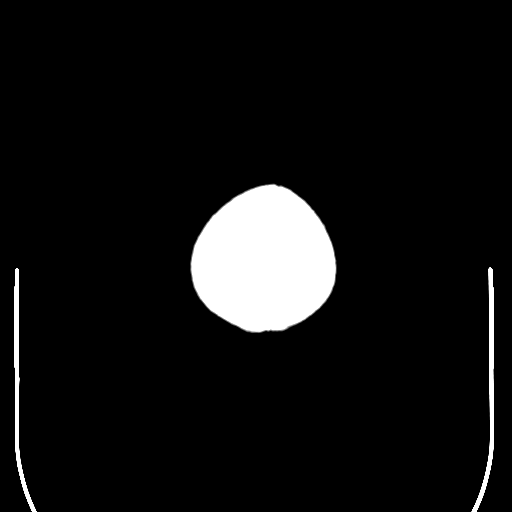
[im 28/30  bone]
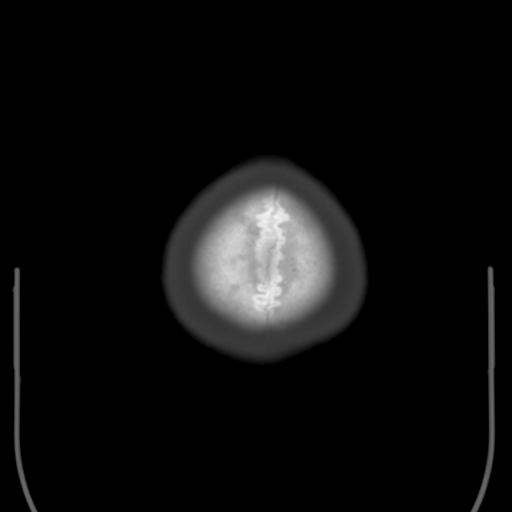

[Series 4: coronal soft tissue · coronal · 0.28mm/px · 3 of 65 slices shown]
[im 22/65  brain]
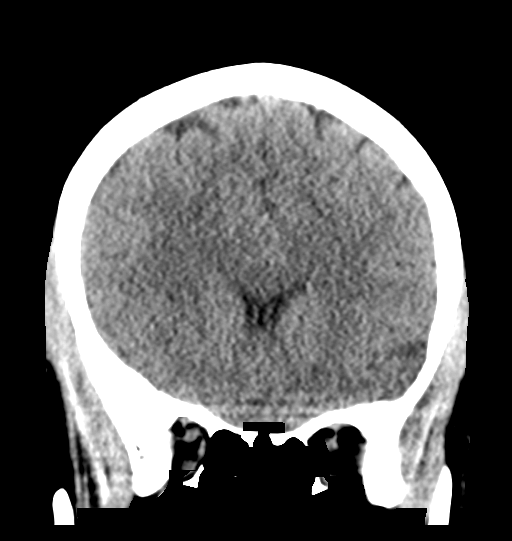
[im 29/65  brain]
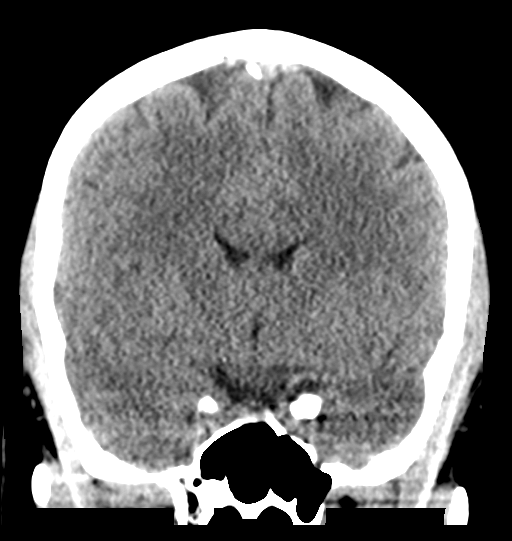
[im 36/65  brain]
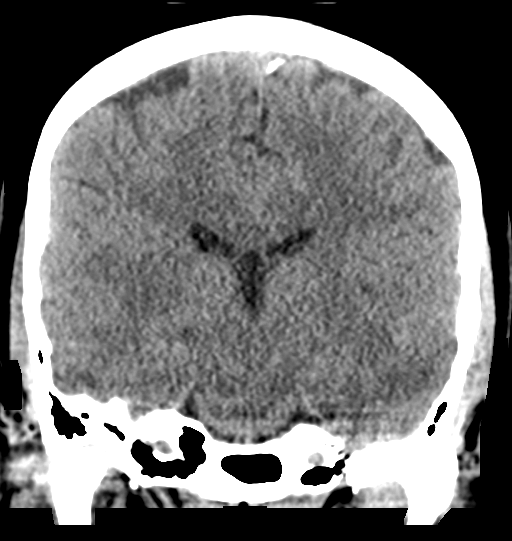

[Series 5: sagittal soft tissue · sagittal · 0.30mm/px · 3 of 47 slices shown]
[im 16/47  brain]
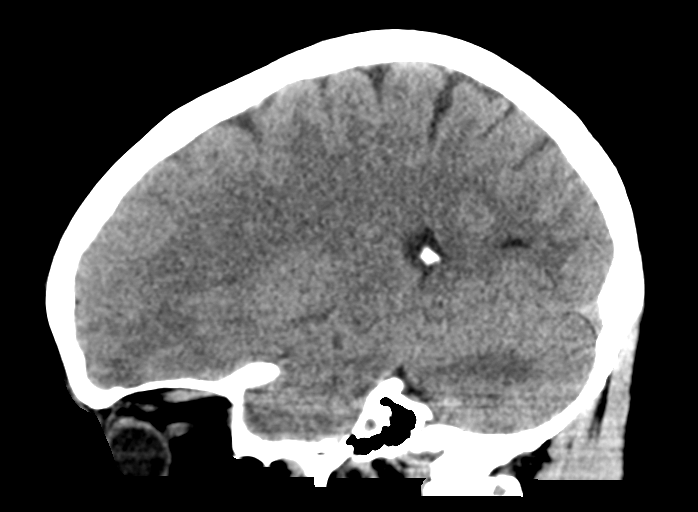
[im 24/47  brain]
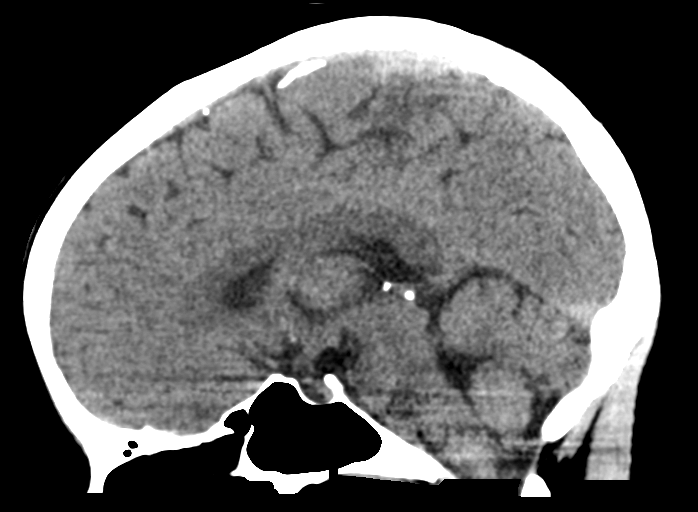
[im 31/47  brain]
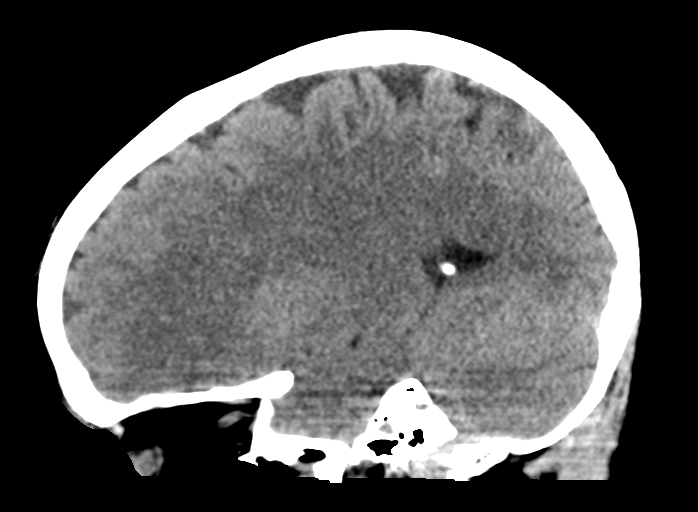

[15 of 47 positions shown; findings below may reference images not displayed]

FINDINGS: Brain:

Cerebral volume is normal.

There is no acute intracranial hemorrhage.

No demarcated cortical infarct.

No extra-axial fluid collection.

No evidence of intracranial mass.

No midline shift.

Vascular: No hyperdense vessel.

Skull: Normal. Negative for fracture or focal lesion.

Sinuses/Orbits: Visualized orbits show no acute finding. No
significant paranasal sinus disease at the imaged levels.
IMPRESSION: Unremarkable non-contrast CT appearance of the brain. No evidence of
acute intracranial abnormality.

## 2022-11-03 ENCOUNTER — Other Ambulatory Visit: Payer: Self-pay

## 2022-11-03 ENCOUNTER — Encounter (HOSPITAL_COMMUNITY): Payer: Self-pay | Admitting: Emergency Medicine

## 2022-11-03 ENCOUNTER — Emergency Department (HOSPITAL_COMMUNITY)
Admission: EM | Admit: 2022-11-03 | Discharge: 2022-11-03 | Discharge status: Against Medical Advice | Payer: BLUE CROSS/BLUE SHIELD | Attending: Physician Assistant | Admitting: Physician Assistant

## 2022-11-03 DIAGNOSIS — Z5329 Procedure and treatment not carried out because of patient's decision for other reasons: Secondary | ICD-10-CM | POA: Diagnosis not present

## 2022-11-03 DIAGNOSIS — R531 Weakness: Secondary | ICD-10-CM | POA: Diagnosis not present

## 2022-11-03 DIAGNOSIS — Z1152 Encounter for screening for COVID-19: Secondary | ICD-10-CM | POA: Insufficient documentation

## 2022-11-03 DIAGNOSIS — R110 Nausea: Secondary | ICD-10-CM | POA: Insufficient documentation

## 2022-11-03 DIAGNOSIS — J3489 Other specified disorders of nose and nasal sinuses: Secondary | ICD-10-CM | POA: Diagnosis not present

## 2022-11-03 DIAGNOSIS — R519 Headache, unspecified: Secondary | ICD-10-CM | POA: Diagnosis not present

## 2022-11-03 DIAGNOSIS — R0981 Nasal congestion: Secondary | ICD-10-CM | POA: Diagnosis not present

## 2022-11-03 LAB — RESP PANEL BY RT-PCR (FLU A&B, COVID) ARPGX2
Influenza A by PCR: NEGATIVE
Influenza B by PCR: NEGATIVE
SARS Coronavirus 2 by RT PCR: NEGATIVE

## 2022-11-03 LAB — GROUP A STREP BY PCR: Group A Strep by PCR: NOT DETECTED

## 2022-11-03 NOTE — ED Triage Notes (Signed)
Patient here with sore throat, headache, coughing, congestion since Monday. Denies known sick contacts.

## 2022-11-03 NOTE — ED Notes (Signed)
Called pt x3 for vitals, no response. 

## 2022-11-03 NOTE — ED Provider Triage Note (Signed)
Emergency Medicine Provider Triage Evaluation Note  Wendy Greene , a 36 y.o. female  was evaluated in triage.  Pt complains of multiple complaints.  Headache, nausea, congestion, rhinorrhea, sore throat, generalized weakness.  Unsure sick contacts.  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  Appropriate orders placed.  Suzzette Gasparro was informed that the remainder of the evaluation will be completed by another provider, this initial triage assessment does not replace that evaluation, and the importance of remaining in the ED until their evaluation is complete.     Fe Okubo A, PA-C 11/03/22 1028

## 2022-11-03 NOTE — ED Notes (Signed)
Called pt x2 for vitals, no response. °

## 2022-11-05 ENCOUNTER — Emergency Department (HOSPITAL_BASED_OUTPATIENT_CLINIC_OR_DEPARTMENT_OTHER): Payer: BLUE CROSS/BLUE SHIELD

## 2022-11-05 ENCOUNTER — Emergency Department (HOSPITAL_BASED_OUTPATIENT_CLINIC_OR_DEPARTMENT_OTHER): Payer: BLUE CROSS/BLUE SHIELD | Admitting: Radiology

## 2022-11-05 ENCOUNTER — Other Ambulatory Visit: Payer: Self-pay

## 2022-11-05 ENCOUNTER — Emergency Department (HOSPITAL_BASED_OUTPATIENT_CLINIC_OR_DEPARTMENT_OTHER)
Admission: EM | Admit: 2022-11-05 | Discharge: 2022-11-05 | Disposition: A | Discharge status: Home / Self Care | Payer: BLUE CROSS/BLUE SHIELD | Attending: Emergency Medicine | Admitting: Emergency Medicine

## 2022-11-05 ENCOUNTER — Encounter (HOSPITAL_BASED_OUTPATIENT_CLINIC_OR_DEPARTMENT_OTHER): Payer: Self-pay

## 2022-11-05 DIAGNOSIS — R519 Headache, unspecified: Secondary | ICD-10-CM | POA: Insufficient documentation

## 2022-11-05 DIAGNOSIS — R059 Cough, unspecified: Secondary | ICD-10-CM | POA: Diagnosis not present

## 2022-11-05 DIAGNOSIS — R079 Chest pain, unspecified: Secondary | ICD-10-CM | POA: Insufficient documentation

## 2022-11-05 DIAGNOSIS — R1011 Right upper quadrant pain: Secondary | ICD-10-CM

## 2022-11-05 DIAGNOSIS — R1031 Right lower quadrant pain: Secondary | ICD-10-CM | POA: Insufficient documentation

## 2022-11-05 LAB — URINALYSIS, ROUTINE W REFLEX MICROSCOPIC
Bilirubin Urine: NEGATIVE
Glucose, UA: NEGATIVE mg/dL
Hgb urine dipstick: NEGATIVE
Ketones, ur: NEGATIVE mg/dL
Leukocytes,Ua: NEGATIVE
Nitrite: NEGATIVE
Specific Gravity, Urine: 1.022 (ref 1.005–1.030)
pH: 8 (ref 5.0–8.0)

## 2022-11-05 LAB — PREGNANCY, URINE: Preg Test, Ur: NEGATIVE

## 2022-11-05 LAB — HEPATIC FUNCTION PANEL
ALT: 10 U/L (ref 0–44)
AST: 19 U/L (ref 15–41)
Albumin: 4.4 g/dL (ref 3.5–5.0)
Alkaline Phosphatase: 47 U/L (ref 38–126)
Bilirubin, Direct: 0.1 mg/dL (ref 0.0–0.2)
Indirect Bilirubin: 0.1 mg/dL — ABNORMAL LOW (ref 0.3–0.9)
Total Bilirubin: 0.2 mg/dL — ABNORMAL LOW (ref 0.3–1.2)
Total Protein: 7.5 g/dL (ref 6.5–8.1)

## 2022-11-05 LAB — CBC
HCT: 40.5 % (ref 36.0–46.0)
Hemoglobin: 13 g/dL (ref 12.0–15.0)
MCH: 27.5 pg (ref 26.0–34.0)
MCHC: 32.1 g/dL (ref 30.0–36.0)
MCV: 85.8 fL (ref 80.0–100.0)
Platelets: 268 10*3/uL (ref 150–400)
RBC: 4.72 MIL/uL (ref 3.87–5.11)
RDW: 12.5 % (ref 11.5–15.5)
WBC: 5.9 10*3/uL (ref 4.0–10.5)
nRBC: 0 % (ref 0.0–0.2)

## 2022-11-05 LAB — LIPASE, BLOOD: Lipase: 63 U/L — ABNORMAL HIGH (ref 11–51)

## 2022-11-05 LAB — BASIC METABOLIC PANEL
Anion gap: 9 (ref 5–15)
BUN: 14 mg/dL (ref 6–20)
CO2: 30 mmol/L (ref 22–32)
Calcium: 9.7 mg/dL (ref 8.9–10.3)
Chloride: 102 mmol/L (ref 98–111)
Creatinine, Ser: 0.85 mg/dL (ref 0.44–1.00)
GFR, Estimated: >60 mL/min (ref >60)
Glucose, Bld: 80 mg/dL (ref 70–99)
Potassium: 4 mmol/L (ref 3.5–5.1)
Sodium: 141 mmol/L (ref 135–145)

## 2022-11-05 LAB — TROPONIN I (HIGH SENSITIVITY): Troponin I (High Sensitivity): <2 ng/L (ref <18)

## 2022-11-05 MED ORDER — PANTOPRAZOLE SODIUM 40 MG PO TBEC: 40.0000 mg | DELAYED_RELEASE_TABLET | Freq: Every day | ORAL | 0 refills | Status: AC

## 2022-11-05 MED ORDER — IOHEXOL 300 MG/ML  SOLN
100.0000 mL | Freq: Once | INTRAMUSCULAR | Status: AC | PRN
  Administered 2022-11-05: 75 mL via INTRAVENOUS

## 2022-11-05 MED ORDER — SUCRALFATE 1 G PO TABS: 1.0000 g | ORAL_TABLET | Freq: Three times a day (TID) | ORAL | 0 refills | Status: DC

## 2022-11-05 MED ORDER — SODIUM CHLORIDE 0.9 % IV BOLUS
1000.0000 mL | Freq: Once | INTRAVENOUS | Status: AC
  Administered 2022-11-05: 1000 mL via INTRAVENOUS

## 2022-11-05 NOTE — ED Notes (Signed)
Pt refusing covid swab at this time

## 2022-11-05 NOTE — ED Provider Notes (Signed)
[facility] EM[facility] DEPT Provider Note   CSN: [id number] Arrival date & time: 11/05/22  1722     History  Chief Complaint  Patient presents with   Chest Pain   Cough   Headache    Wendy Greene is a 36 y.o. female.  HPI 36 year old female presents with chest pain and abdominal pain.  About a week ago she developed some sort of respiratory infection which include sore throat, cough, headache.  The headache has been ongoing ever since 1 week ago, primarily in the morning.  No vomiting.  However today since waking up she has been having chest pain that waxes and wanes.  Also some abdominal pain.  She states she has a history of diverticulitis as well as IBS though this pain is primarily in her upper abdomen.  No urinary symptoms.  Is also having some back pain.  Cough is primarily gone.  Home Medications Prior to Admission medications   Medication Sig Start Date End Date Taking? Authorizing Provider  pantoprazole (PROTONIX) 40 MG tablet Take 1 tablet (40 mg total) by mouth daily. 11/05/22  Yes Pricilla Loveless, MD  sucralfate (CARAFATE) 1 g tablet Take 1 tablet (1 g total) by mouth 4 (four) times daily -  with meals and at bedtime. 11/05/22  Yes Pricilla Loveless, MD  dicyclomine (BENTYL) 20 MG tablet Take 1 tablet (20 mg total) by mouth 2 (two) times daily. 04/25/22   Honor Loh M, PA-C  famotidine (PEPCID) 20 MG tablet Take 1 tablet (20 mg total) by mouth 2 (two) times daily. 10/26/21   Renne Crigler, PA-C  predniSONE (DELTASONE) 10 MG tablet Take 4 tablets (40 mg total) by mouth daily. 04/25/22   Teressa Lower, PA-C      Allergies    Patient has no known allergies.    Review of Systems   Review of Systems  Constitutional:  Negative for fever.  Respiratory:  Negative for shortness of breath.   Cardiovascular:  Positive for chest pain.  Gastrointestinal:  Positive for abdominal pain. Negative for vomiting.  Genitourinary:  Negative for dysuria.   Musculoskeletal:  Positive for back pain.  Neurological:  Positive for headaches. Negative for weakness and numbness.    Physical Exam Updated Vital Signs BP 117/77   Pulse 90   Temp 98.5 F (36.9 C)   Resp 18   Ht 5\' 2"  (1.575 m)   Wt 51.3 kg   LMP 10/23/2022 (Exact Date)   SpO2 100%   BMI 20.67 kg/m  Physical Exam Vitals and nursing note reviewed.  Constitutional:      Appearance: She is well-developed.  HENT:     Head: Normocephalic and atraumatic.  Cardiovascular:     Rate and Rhythm: Normal rate and regular rhythm.     Heart sounds: Normal heart sounds.  Pulmonary:     Effort: Pulmonary effort is normal.     Breath sounds: Normal breath sounds.  Abdominal:     Palpations: Abdomen is soft.     Tenderness: There is abdominal tenderness in the right upper quadrant.  Skin:    General: Skin is warm and dry.  Neurological:     Mental Status: She is alert.     Comments: CN 3-12 grossly intact. 5/5 strength in all 4 extremities. Grossly normal sensation. Normal finger to nose.      ED Results / Procedures / Treatments   Labs (all labs ordered are listed, but only abnormal results are displayed) Labs Reviewed  HEPATIC FUNCTION  PANEL - Abnormal; Notable for the following components:      Result Value   Total Bilirubin 0.2 (*)    Indirect Bilirubin 0.1 (*)    All other components within normal limits  LIPASE, BLOOD - Abnormal; Notable for the following components:   Lipase 63 (*)    All other components within normal limits  URINALYSIS, ROUTINE W REFLEX MICROSCOPIC - Abnormal; Notable for the following components:   Protein, ur TRACE (*)    All other components within normal limits  BASIC METABOLIC PANEL  CBC  PREGNANCY, URINE  TROPONIN I (HIGH SENSITIVITY)    EKG EKG Interpretation  Date/Time:  Monday November 05 2022 17:32:42 EST Ventricular Rate:  97 PR Interval:  126 QRS Duration: 76 QT Interval:  340 QTC Calculation: 431 R Axis:   80 Text  Interpretation: Normal sinus rhythm Right atrial enlargement no acute ST/T changes similar to 2018 Confirmed by Pricilla Loveless [id number]) on 11/05/2022 9:14:54 PM  Radiology CT ABDOMEN PELVIS W CONTRAST  Result Date: 11/05/2022 CLINICAL DATA:  Cholecystitis EXAM: CT ABDOMEN AND PELVIS WITH CONTRAST TECHNIQUE: Multidetector CT imaging of the abdomen and pelvis was performed using the standard protocol following bolus administration of intravenous contrast. RADIATION DOSE REDUCTION: This exam was performed according to the departmental dose-optimization program which includes automated exposure control, adjustment of the mA and/or kV according to patient size and/or use of iterative reconstruction technique. CONTRAST:  7mL OMNIPAQUE IOHEXOL 300 MG/ML  SOLN COMPARISON:  04/25/2022 FINDINGS: Lower chest: No acute abnormality. Hepatobiliary: No focal liver abnormality is seen. No gallstones, gallbladder wall thickening, or biliary dilatation. Pancreas: Unremarkable Spleen: Unremarkable Adrenals/Urinary Tract: Adrenal glands are unremarkable. Kidneys are normal, without renal calculi, focal lesion, or hydronephrosis. Bladder is unremarkable. Stomach/Bowel: Stomach is within normal limits. Appendix appears normal. No evidence of bowel wall thickening, distention, or inflammatory changes. Vascular/Lymphatic: No significant vascular findings are present. No enlarged abdominal or pelvic lymph nodes. Reproductive: Mild free fluid within the pelvis may be physiologic. Pelvic organs are unremarkable. Other: No abdominal wall hernia. Musculoskeletal: No acute or significant osseous findings. IMPRESSION: 1. No acute intra-abdominal pathology identified. No definite radiographic explanation for the patient's reported symptoms. 2. Mild free fluid within the pelvis, possibly physiologic. Electronically Signed   By: Helyn Numbers M.D.   On: 11/05/2022 22:34   DG Chest 2 View  Result Date: 11/05/2022 CLINICAL DATA:  Chest  pain EXAM: CHEST - 2 VIEW COMPARISON:  Chest x-ray dated August 18, 2020 FINDINGS: The heart size and mediastinal contours are within normal limits. Both lungs are clear. The visualized skeletal structures are unremarkable. IMPRESSION: No active cardiopulmonary disease. Electronically Signed   By: Allegra Lai M.D.   On: 11/05/2022 18:19    Procedures Procedures    Medications Ordered in ED Medications  sodium chloride 0.9 % bolus 1,000 mL (1,000 mLs Intravenous New Bag/Given 11/05/22 2152)  iohexol (OMNIPAQUE) 300 MG/ML solution 100 mL (75 mLs Intravenous Contrast Given 11/05/22 2159)    ED Course/ Medical Decision Making/ A&P                           Medical Decision Making Amount and/or Complexity of Data Reviewed Labs: ordered.    Details: Normal troponin, WBC, electrolytes and kidney function. Radiology: ordered and independent interpretation performed.    Details: No pneumonia on x-ray. CT without obvious cholecystitis or other emergent condition ECG/medicine tests: independent interpretation performed.    Details: No acute ischemia.  Risk Prescription drug management.   Patient presents with multiple complaints but primarily chest/abdominal pain.  Troponin is normal after symptoms all day.  EKG is not ischemic.  Low suspicion for ACS, PE, dissection.  Vitals are okay.  CT was obtained given no ultrasound availability at this time of night at this facility.  It is overall unremarkable.  I do think there is still a small chance that she could have gallstones not seen on the CT so we will have her come back tomorrow for right upper quadrant ultrasound but I have a really low suspicion of cholecystitis.  She has declined medicines.  I think this also possible she has gastritis/esophagitis given the location of her discomfort so we will give prescription for Protonix and Carafate.  She is already on Pepcid.  Otherwise she does have a headache this been ongoing for about a week  since having some sort of viral illness but has a benign neuro exam and I have very low suspicion of acute CNS emergency such as mass, bleeding, infection.  Will discharge home with return precautions.        Final Clinical Impression(s) / ED Diagnoses Final diagnoses:  Right upper quadrant abdominal pain    Rx / DC Orders ED Discharge Orders          Ordered    pantoprazole (PROTONIX) 40 MG tablet  Daily        11/05/22 2245    sucralfate (CARAFATE) 1 g tablet  3 times daily with meals & bedtime        11/05/22 2245    US Abdomen Limited RUQ/Gall Bladder        11/05/22 2257              Pricilla Loveless, MD 11/05/22 2305

## 2022-11-05 NOTE — ED Triage Notes (Signed)
Pt reports headache, chest tightness, and back pain x1 day.

## 2022-11-05 NOTE — Discharge Instructions (Addendum)
If you develop worsening, continued, or recurrent abdominal pain, uncontrolled vomiting, fever, chest or back pain, or any other new/concerning symptoms then return to the ER for evaluation.   IMPORTANT PATIENT INSTRUCTIONS:  You have been scheduled for an Outpatient Ultrasound.    Your appointment has been scheduled for:  _______ am/pm on _______________ (date).  If your appointment is scheduled for a Saturday, Sunday or holiday, please go to the [facility] Emergency Department Registration Desk at least 15 minutes prior to your appointment time and tell them you are there for an ultrasound.    If your appointment is scheduled for a weekday (Monday - Friday), please go directly to the [facility] Radiology Department reception area at least 15 minutes prior to your appointment time and tell them you are there for an ultrasound.  Please call [phone] with questions. 

## 2022-11-07 ENCOUNTER — Ambulatory Visit (HOSPITAL_BASED_OUTPATIENT_CLINIC_OR_DEPARTMENT_OTHER): Admit: 2022-11-07 | Payer: BLUE CROSS/BLUE SHIELD

## 2022-11-17 ENCOUNTER — Ambulatory Visit (HOSPITAL_BASED_OUTPATIENT_CLINIC_OR_DEPARTMENT_OTHER): Payer: BLUE CROSS/BLUE SHIELD

## 2022-11-23 DIAGNOSIS — Z419 Encounter for procedure for purposes other than remedying health state, unspecified: Secondary | ICD-10-CM | POA: Diagnosis not present

## 2022-12-24 DIAGNOSIS — Z419 Encounter for procedure for purposes other than remedying health state, unspecified: Secondary | ICD-10-CM | POA: Diagnosis not present

## 2023-03-05 ENCOUNTER — Telehealth: Payer: Self-pay

## 2023-03-05 NOTE — Telephone Encounter (Signed)
Mychart msg sent

## 2023-05-20 ENCOUNTER — Encounter (HOSPITAL_COMMUNITY): Payer: Self-pay

## 2023-05-20 ENCOUNTER — Ambulatory Visit (HOSPITAL_COMMUNITY)
Admission: EM | Admit: 2023-05-20 | Discharge: 2023-05-20 | Disposition: A | Discharge status: Home / Self Care | Payer: BLUE CROSS/BLUE SHIELD | Attending: Emergency Medicine | Admitting: Emergency Medicine

## 2023-05-20 DIAGNOSIS — R42 Dizziness and giddiness: Secondary | ICD-10-CM

## 2023-05-20 DIAGNOSIS — R110 Nausea: Secondary | ICD-10-CM | POA: Diagnosis not present

## 2023-05-20 HISTORY — DX: Diverticulosis of intestine, part unspecified, without perforation or abscess without bleeding: K57.90

## 2023-05-20 HISTORY — DX: Irritable bowel syndrome, unspecified: K58.9

## 2023-05-20 MED ORDER — METOCLOPRAMIDE HCL 10 MG PO TABS: 10.0000 mg | ORAL_TABLET | Freq: Four times a day (QID) | ORAL | 0 refills | Status: AC

## 2023-05-20 NOTE — ED Triage Notes (Signed)
Pt c/o severe nausea "to where I need to sit down" worsens when standing or moving too fast. Associated with headache at times.   Onset ~ 6 days ago   Reports hx of ibs and diverticulosis.

## 2023-05-20 NOTE — ED Provider Notes (Signed)
[facility]    CSN: [id number] Arrival date & time: 05/20/23  1442      History   Chief Complaint Chief Complaint  Patient presents with   Nausea    HPI Wendy Greene is a 37 y.o. female.  6 day history of intermittent nausea and dizziness. Reports it comes in waves and has no known trigger. Nausea will start first and then she sometimes gets dizzy with fast motion of the head or standing up quickly. However does not occur every time.  Denies vomiting. No abdominal pain.  Hx of IBS but BMs are no different than her normal. No blood/melena. No urinary symptoms No fever  Denies possibility of pregnancy, has not had intercourse in over 1 year and menstrual cycle ended last week  Past Medical History:  Diagnosis Date   Diverticulosis    IBS (irritable bowel syndrome)     There are no problems to display for this patient.   Past Surgical History:  Procedure Laterality Date   THYROIDECTOMY, PARTIAL  2011    OB History     Gravida  2   Para  2   Term  0   Preterm  2   AB  0   Living  2      SAB  0   IAB  0   Ectopic  0   Multiple  0   Live Births  2            Home Medications    Prior to Admission medications   Medication Sig Start Date End Date Taking? Authorizing Provider  metoCLOPramide (REGLAN) 10 MG tablet Take 1 tablet (10 mg total) by mouth every 6 (six) hours. 05/20/23  Yes Rayelle Armor, Lurena Joiner, PA-C  famotidine (PEPCID) 20 MG tablet Take 1 tablet (20 mg total) by mouth 2 (two) times daily. 10/26/21   Renne Crigler, PA-C  pantoprazole (PROTONIX) 40 MG tablet Take 1 tablet (40 mg total) by mouth daily. 11/05/22   Pricilla Loveless, MD    Family History Family History  Problem Relation Age of Onset   Hypertension Mother    Hypertension Father     Social History Social History   Tobacco Use   Smoking status: Former    Types: Cigarettes    Quit date: 03/09/2015    Years since quitting: 8.2   Smokeless tobacco: Never   Vaping Use   Vaping Use: Every day  Substance Use Topics   Alcohol use: Yes    Comment: soc   Drug use: No     Allergies   Patient has no known allergies.   Review of Systems Review of Systems As per HPI  Physical Exam Triage Vital Signs ED Triage Vitals [05/20/23 1552]  Enc Vitals Group     BP 111/67     Pulse Rate 77     Resp 16     Temp 98.4 F (36.9 C)     Temp Source Oral     SpO2 97 %     Weight      Height      Head Circumference      Peak Flow      Pain Score 0     Pain Loc      Pain Edu?      Excl. in GC?    No data found.  Updated Vital Signs BP 111/67 (BP Location: Left Arm)   Pulse 77   Temp 98.4 F (36.9 C) (Oral)  Resp 16   SpO2 97%    Physical Exam Vitals and nursing note reviewed.  Constitutional:      General: She is not in acute distress. HENT:     Head: Normocephalic and atraumatic.     Right Ear: Tympanic membrane and ear canal normal.     Left Ear: Tympanic membrane and ear canal normal.     Nose: Nose normal.     Mouth/Throat:     Mouth: Mucous membranes are moist.     Pharynx: Oropharynx is clear.  Eyes:     Extraocular Movements: Extraocular movements intact.     Conjunctiva/sclera: Conjunctivae normal.     Pupils: Pupils are equal, round, and reactive to light.  Cardiovascular:     Rate and Rhythm: Normal rate and regular rhythm.     Heart sounds: Normal heart sounds.  Pulmonary:     Effort: Pulmonary effort is normal. No respiratory distress.     Breath sounds: Normal breath sounds.  Abdominal:     General: Bowel sounds are normal.     Palpations: Abdomen is soft.     Tenderness: There is no abdominal tenderness. There is no guarding or rebound.  Musculoskeletal:        General: Normal range of motion.     Cervical back: Normal range of motion.  Skin:    General: Skin is warm and dry.  Neurological:     Mental Status: She is alert and oriented to person, place, and time.     Cranial Nerves: Cranial nerves  2-12 are intact. No cranial nerve deficit.     Sensory: Sensation is intact. No sensory deficit.     Motor: Motor function is intact. No weakness.     Coordination: Coordination is intact. Coordination normal.     Gait: Gait normal.     Comments: Strength and sensation intact throughout      UC Treatments / Results  Labs (all labs ordered are listed, but only abnormal results are displayed) Labs Reviewed - No data to display  EKG   Radiology No results found.  Procedures Procedures (including critical care time)  Medications Ordered in UC Medications - No data to display  Initial Impression / Assessment and Plan / UC Course  I have reviewed the triage vital signs and the nursing notes.  Pertinent labs & imaging results that were available during my care of the patient were reviewed by me and considered in my medical decision making (see chart for details).  Declines UPT today. Afebrile, well appearing, good exam. No red flags. Neuro exam intact.  Nausea unknown etiology. Could be vertigo-associated Reglan 10 mg q6 hours prn, try for the next week. Will follow closely with PCP if persisting symptoms. Discussed strict ED precautions for worsening. Patient agreeable to plan  Final Clinical Impressions(s) / UC Diagnoses   Final diagnoses:  Vertigo  Nausea     Discharge Instructions      Try the Reglan, one pill every 6 hours, for nausea. This may also help with vertigo/dizziness. If you are trying this medication and still having symptoms, please follow closely with your primary care provider  If at any point your symptoms worsen, please go directly to the emergency department.  Drink lots of fluids!     ED Prescriptions     Medication Sig Dispense Auth. Provider   metoCLOPramide (REGLAN) 10 MG tablet Take 1 tablet (10 mg total) by mouth every 6 (six) hours. 30 tablet Leasa Kincannon, Lurena Joiner, [place]  PDMP not reviewed this encounter.   Chandra Asher, Ray Church 05/20/23 2024

## 2023-05-20 NOTE — Discharge Instructions (Addendum)
Try the Reglan, one pill every 6 hours, for nausea. This may also help with vertigo/dizziness. If you are trying this medication and still having symptoms, please follow closely with your primary care provider  If at any point your symptoms worsen, please go directly to the emergency department.  Drink lots of fluids!

## 2024-01-01 ENCOUNTER — Encounter (HOSPITAL_COMMUNITY): Payer: Self-pay

## 2024-01-01 ENCOUNTER — Ambulatory Visit (HOSPITAL_COMMUNITY)
Admission: EM | Admit: 2024-01-01 | Discharge: 2024-01-01 | Disposition: A | Discharge status: Home / Self Care | Payer: Medicaid Other | Attending: Family Medicine | Admitting: Family Medicine

## 2024-01-01 DIAGNOSIS — R509 Fever, unspecified: Secondary | ICD-10-CM | POA: Diagnosis present

## 2024-01-01 DIAGNOSIS — R519 Headache, unspecified: Secondary | ICD-10-CM | POA: Insufficient documentation

## 2024-01-01 DIAGNOSIS — B349 Viral infection, unspecified: Secondary | ICD-10-CM | POA: Diagnosis present

## 2024-01-01 LAB — POCT INFLUENZA A/B
Influenza A, POC: NEGATIVE
Influenza B, POC: NEGATIVE

## 2024-01-01 MED ORDER — ACETAMINOPHEN 325 MG PO TABS
ORAL_TABLET | ORAL | Status: AC
  Filled 2024-01-01: qty 2

## 2024-01-01 MED ORDER — ACETAMINOPHEN 325 MG PO TABS
650.0000 mg | ORAL_TABLET | Freq: Once | ORAL | Status: AC
  Administered 2024-01-01: 650 mg via ORAL

## 2024-01-01 MED ORDER — OSELTAMIVIR PHOSPHATE 75 MG PO CAPS: 75.0000 mg | ORAL_CAPSULE | Freq: Two times a day (BID) | ORAL | 0 refills | Status: AC

## 2024-01-01 NOTE — ED Provider Notes (Signed)
 [facility]    CSN: [id number] Arrival date & time: 01/01/24  0803      History   Chief Complaint Chief Complaint  Patient presents with   Cough   Fever    HPI Wendy Greene is a 38 y.o. female.    Cough Associated symptoms: fever and headaches   Associated symptoms: no shortness of breath, no sore throat and no wheezing   Fever Associated symptoms: congestion, cough, headaches and nausea   Associated symptoms: no sore throat and no vomiting    Patient is here for cough, fever, headache, congestion since yesterday.   Temp of 101 last night.  Some nausea, no vomiting.  No known sick contacts.  She took tylenol , mucinex, delsym.        Past Medical History:  Diagnosis Date   Diverticulosis    IBS (irritable bowel syndrome)     There are no active problems to display for this patient.   Past Surgical History:  Procedure Laterality Date   THYROIDECTOMY, PARTIAL  2011    OB History     Gravida  2   Para  2   Term  0   Preterm  2   AB  0   Living  2      SAB  0   IAB  0   Ectopic  0   Multiple  0   Live Births  2            Home Medications    Prior to Admission medications   Medication Sig Start Date End Date Taking? Authorizing Provider  famotidine  (PEPCID ) 20 MG tablet Take 1 tablet (20 mg total) by mouth 2 (two) times daily. 10/26/21   Geiple, Joshua, PA-C  metoCLOPramide  (REGLAN ) 10 MG tablet Take 1 tablet (10 mg total) by mouth every 6 (six) hours. 05/20/23   Rising, Asberry, PA-C  pantoprazole  (PROTONIX ) 40 MG tablet Take 1 tablet (40 mg total) by mouth daily. 11/05/22   Freddi Hamilton, MD    Family History Family History  Problem Relation Age of Onset   Hypertension Mother    Hypertension Father     Social History Social History   Tobacco Use   Smoking status: Former    Current packs/day: 0.00    Types: Cigarettes    Quit date: 03/09/2015    Years since quitting: 8.8   Smokeless tobacco: Never   Vaping Use   Vaping status: Every Day  Substance Use Topics   Alcohol use: Yes    Comment: soc   Drug use: No     Allergies   Patient has no known allergies.   Review of Systems Review of Systems  Constitutional:  Positive for fever.  HENT:  Positive for congestion. Negative for sore throat.   Respiratory:  Positive for cough. Negative for shortness of breath and wheezing.   Cardiovascular: Negative.   Gastrointestinal:  Positive for nausea. Negative for vomiting.  Neurological:  Positive for headaches.  Psychiatric/Behavioral: Negative.       Physical Exam Triage Vital Signs ED Triage Vitals  Encounter Vitals Group     BP 01/01/24 0816 119/72     Systolic BP Percentile --      Diastolic BP Percentile --      Pulse Rate 01/01/24 0816 (!) 125     Resp 01/01/24 0816 18     Temp 01/01/24 0816 (!) 102.7 F (39.3 C)     Temp Source 01/01/24 0816 Oral  SpO2 01/01/24 0816 98 %     Weight --      Height --      Head Circumference --      Peak Flow --      Pain Score 01/01/24 0817 9     Pain Loc --      Pain Education --      Exclude from Growth Chart --    No data found.  Updated Vital Signs BP 119/72 (BP Location: Right Arm)   Pulse (!) 125   Temp (!) 102.7 F (39.3 C) (Oral)   Resp 18   LMP 01/01/2024   SpO2 98%   Visual Acuity Right Eye Distance:   Left Eye Distance:   Bilateral Distance:    Right Eye Near:   Left Eye Near:    Bilateral Near:     Physical Exam Constitutional:      General: She is not in acute distress.    Appearance: Normal appearance. She is normal weight.  HENT:     Nose: Nose normal.     Mouth/Throat:     Mouth: Mucous membranes are moist.  Cardiovascular:     Rate and Rhythm: Normal rate and regular rhythm.  Pulmonary:     Effort: Pulmonary effort is normal.     Breath sounds: Normal breath sounds.  Musculoskeletal:        General: No tenderness or deformity. Normal range of motion.     Cervical back: Normal range  of motion and neck supple.  Lymphadenopathy:     Cervical: No cervical adenopathy.  Skin:    General: Skin is warm.  Neurological:     General: No focal deficit present.     Mental Status: She is alert and oriented to person, place, and time.  Psychiatric:        Mood and Affect: Mood normal.      UC Treatments / Results  Labs (all labs ordered are listed, but only abnormal results are displayed) Labs Reviewed  POCT INFLUENZA A/B - Normal  SARS CORONAVIRUS 2 (TAT 6-24 HRS)    EKG   Radiology No results found.  Procedures Procedures (including critical care time)  Medications Ordered in UC Medications  acetaminophen  (TYLENOL ) tablet 650 mg (650 mg Oral Given 01/01/24 9178)    Initial Impression / Assessment and Plan / UC Course  I have reviewed the triage vital signs and the nursing notes.  Pertinent labs & imaging results that were available during my care of the patient were reviewed by me and considered in my medical decision making (see chart for details).   Final Clinical Impressions(s) / UC Diagnoses   Final diagnoses:  Viral syndrome  Fever, unspecified fever cause  Nonintractable headache, unspecified chronicity pattern, unspecified headache type     Discharge Instructions      You were seen today for an upper respiratory infection. Your flu swab was negative today, so you were swabbed for covid and this will be resulted tomorrow.  You will see this on my chart and you will be notified if positive.  In the mean time I have sent out tamiflu  for presumed influenza.  I recommend you continue tylenol  for fever every 6 hrs. Please get plenty of rest and fluids.   Please return if you are not improving or worsening over the next 24-48 hrs, or be seen in the ER.     ED Prescriptions     Medication Sig Dispense Auth. Provider   oseltamivir  (  TAMIFLU ) 75 MG capsule Take 1 capsule (75 mg total) by mouth every 12 (twelve) hours. 10 capsule Darral Longs, MD       PDMP not reviewed this encounter.   Darral Longs, MD 01/01/24 [phone]

## 2024-01-01 NOTE — ED Triage Notes (Signed)
 Pt c/o fever, cough, congestion, headache, and body aches since yesterday. Took OTC meds yesterday with no relief.

## 2024-01-01 NOTE — Discharge Instructions (Addendum)
 You were seen today for an upper respiratory infection. Your flu swab was negative today, so you were swabbed for covid and this will be resulted tomorrow.  You will see this on my chart and you will be notified if positive.  In the mean time I have sent out tamiflu  for presumed influenza.  I recommend you continue tylenol  for fever every 6 hrs. Please get plenty of rest and fluids.   Please return if you are not improving or worsening over the next 24-48 hrs, or be seen in the ER.

## 2024-01-02 LAB — SARS CORONAVIRUS 2 (TAT 6-24 HRS): SARS Coronavirus 2: POSITIVE — AB

## 2024-08-31 ENCOUNTER — Emergency Department (HOSPITAL_COMMUNITY)
Admission: EM | Admit: 2024-08-31 | Discharge: 2024-08-31 | Disposition: A | Discharge status: Home / Self Care | Attending: Emergency Medicine | Admitting: Emergency Medicine

## 2024-08-31 ENCOUNTER — Emergency Department (HOSPITAL_COMMUNITY)

## 2024-08-31 ENCOUNTER — Other Ambulatory Visit: Payer: Self-pay

## 2024-08-31 DIAGNOSIS — R1032 Left lower quadrant pain: Secondary | ICD-10-CM | POA: Insufficient documentation

## 2024-08-31 DIAGNOSIS — R1012 Left upper quadrant pain: Secondary | ICD-10-CM | POA: Diagnosis not present

## 2024-08-31 DIAGNOSIS — R109 Unspecified abdominal pain: Secondary | ICD-10-CM | POA: Diagnosis present

## 2024-08-31 LAB — COMPREHENSIVE METABOLIC PANEL WITH GFR
ALT: 18 U/L (ref 0–44)
AST: 23 U/L (ref 15–41)
Albumin: 4.1 g/dL (ref 3.5–5.0)
Alkaline Phosphatase: 61 U/L (ref 38–126)
Anion gap: 11 (ref 5–15)
BUN: 11 mg/dL (ref 6–20)
CO2: 25 mmol/L (ref 22–32)
Calcium: 9.4 mg/dL (ref 8.9–10.3)
Chloride: 101 mmol/L (ref 98–111)
Creatinine, Ser: 0.9 mg/dL (ref 0.44–1.00)
GFR, Estimated: >60 mL/min (ref >60)
Glucose, Bld: 88 mg/dL (ref 70–99)
Potassium: 3.2 mmol/L — ABNORMAL LOW (ref 3.5–5.1)
Sodium: 137 mmol/L (ref 135–145)
Total Bilirubin: 0.9 mg/dL (ref 0.0–1.2)
Total Protein: 7.8 g/dL (ref 6.5–8.1)

## 2024-08-31 LAB — CBC
HCT: 43.7 % (ref 36.0–46.0)
Hemoglobin: 14.3 g/dL (ref 12.0–15.0)
MCH: 28.2 pg (ref 26.0–34.0)
MCHC: 32.7 g/dL (ref 30.0–36.0)
MCV: 86.2 fL (ref 80.0–100.0)
Platelets: 257 K/uL (ref 150–400)
RBC: 5.07 MIL/uL (ref 3.87–5.11)
RDW: 12.5 % (ref 11.5–15.5)
WBC: 5 K/uL (ref 4.0–10.5)
nRBC: 0 % (ref 0.0–0.2)

## 2024-08-31 LAB — URINALYSIS, ROUTINE W REFLEX MICROSCOPIC
Bilirubin Urine: NEGATIVE
Glucose, UA: NEGATIVE mg/dL
Hgb urine dipstick: NEGATIVE
Ketones, ur: 5 mg/dL — AB
Leukocytes,Ua: NEGATIVE
Nitrite: NEGATIVE
Protein, ur: NEGATIVE mg/dL
Specific Gravity, Urine: 1.019 (ref 1.005–1.030)
pH: 5 (ref 5.0–8.0)

## 2024-08-31 LAB — POC URINE PREG, ED: Preg Test, Ur: NEGATIVE

## 2024-08-31 LAB — LIPASE, BLOOD: Lipase: 44 U/L (ref 11–51)

## 2024-08-31 MED ORDER — SODIUM CHLORIDE 0.9 % IV BOLUS
1000.0000 mL | Freq: Once | INTRAVENOUS | Status: AC
  Administered 2024-08-31: 1000 mL via INTRAVENOUS

## 2024-08-31 MED ORDER — ONDANSETRON HCL 4 MG/2ML IJ SOLN
4.0000 mg | Freq: Once | INTRAMUSCULAR | Status: AC
  Administered 2024-08-31: 4 mg via INTRAVENOUS
  Filled 2024-08-31: qty 2

## 2024-08-31 MED ORDER — HYDROMORPHONE HCL 1 MG/ML IJ SOLN
1.0000 mg | Freq: Once | INTRAMUSCULAR | Status: AC
  Administered 2024-08-31: 1 mg via INTRAVENOUS
  Filled 2024-08-31: qty 1

## 2024-08-31 MED ORDER — MORPHINE SULFATE (PF) 4 MG/ML IV SOLN
4.0000 mg | Freq: Once | INTRAVENOUS | Status: DC
  Filled 2024-08-31: qty 1

## 2024-08-31 MED ORDER — IOHEXOL 300 MG/ML  SOLN
100.0000 mL | Freq: Once | INTRAMUSCULAR | Status: AC | PRN
  Administered 2024-08-31: 100 mL via INTRAVENOUS

## 2024-08-31 MED ORDER — HYDROCODONE-ACETAMINOPHEN 5-325 MG PO TABS: 1.0000 | ORAL_TABLET | Freq: Four times a day (QID) | ORAL | 0 refills | Status: AC | PRN

## 2024-08-31 NOTE — ED Provider Notes (Signed)
 [place] EMERGENCY DEPARTMENT AT [facility] Provider Note   CSN: [id number] Arrival date & time: 08/31/24  9647     Patient presents with: Abdominal Pain   Wendy Greene is a 38 y.o. female.   Patient is a 38 year old female with history of GERD.  Patient presenting today with complaints of abdominal pain.  For several weeks she has been experiencing left-sided abdominal discomfort that has been constant.  She does describe some episodes of constipation.  No diarrhea or urinary complaints.  Patient was seen in the emergency department in [place] and was told she likely had colitis.  She just finished taking Augmentin last night, but is having no improvement.  No fevers or chills.  No ill contacts.       Prior to Admission medications   Medication Sig Start Date End Date Taking? Authorizing Provider  famotidine  (PEPCID ) 20 MG tablet Take 1 tablet (20 mg total) by mouth 2 (two) times daily. 10/26/21   Geiple, Joshua, PA-C  metoCLOPramide  (REGLAN ) 10 MG tablet Take 1 tablet (10 mg total) by mouth every 6 (six) hours. 05/20/23   Rising, Asberry, PA-C  oseltamivir  (TAMIFLU ) 75 MG capsule Take 1 capsule (75 mg total) by mouth every 12 (twelve) hours. 01/01/24   Piontek, Rocky, MD  pantoprazole  (PROTONIX ) 40 MG tablet Take 1 tablet (40 mg total) by mouth daily. 11/05/22   Freddi Hamilton, MD    Allergies: Patient has no known allergies.    Review of Systems  All other systems reviewed and are negative.   Updated Vital Signs BP 119/72   Pulse 82   Temp 98.4 F (36.9 C) (Oral)   Resp 16   LMP 08/23/2024 (Approximate)   SpO2 98%   Physical Exam Vitals and nursing note reviewed.  Constitutional:      General: She is not in acute distress.    Appearance: She is well-developed. She is not diaphoretic.  HENT:     Head: Normocephalic and atraumatic.  Cardiovascular:     Rate and Rhythm: Normal rate and regular rhythm.     Heart sounds: No murmur heard.    No friction  rub. No gallop.  Pulmonary:     Effort: Pulmonary effort is normal. No respiratory distress.     Breath sounds: Normal breath sounds. No wheezing.  Abdominal:     General: Bowel sounds are normal. There is no distension.     Palpations: Abdomen is soft.     Tenderness: There is abdominal tenderness in the left upper quadrant and left lower quadrant. There is no right CVA tenderness, left CVA tenderness, guarding or rebound.  Musculoskeletal:        General: Normal range of motion.     Cervical back: Normal range of motion and neck supple.  Skin:    General: Skin is warm and dry.  Neurological:     General: No focal deficit present.     Mental Status: She is alert and oriented to person, place, and time.     (all labs ordered are listed, but only abnormal results are displayed) Labs Reviewed  LIPASE, BLOOD  COMPREHENSIVE METABOLIC PANEL WITH GFR  CBC  URINALYSIS, ROUTINE W REFLEX MICROSCOPIC  POC URINE PREG, ED    EKG: None  Radiology: No results found.   Procedures   Medications Ordered in the ED  sodium chloride  0.9 % bolus 1,000 mL (has no administration in time range)  ondansetron  (ZOFRAN ) injection 4 mg (has no administration in time range)  morphine  (PF) 4 MG/ML injection 4 mg (has no administration in time range)                                    Medical Decision Making Amount and/or Complexity of Data Reviewed Labs: ordered. Radiology: ordered.  Risk Prescription drug management.   Patient is a 38 year old female presenting with abdominal pain.  This has been ongoing for what sounds like several months.  She has been worked up extensively in [place], both in the ER and by gastroenterology.  She has had a CT scan, laboratory studies, and endoscopy and colonoscopy, but no definitive cause has been found.  Patient arrives here with stable vital signs and is afebrile.  She has mild tenderness to the left upper and left lower quadrants, but no peritoneal  signs.  Laboratory studies obtained including CBC, CMP, and lipase, all of which are unremarkable.  Urinalysis is clear and pregnancy test is negative.  CT scan of the abdomen and pelvis obtained showing no acute intra-abdominal process.  Patient hydrated with normal saline.  She has been given Dilaudid  for pain and Zofran  for nausea.  At the time of my reassessment, she is in no distress and clinically well-appearing.  I discussed the results of her tests and that no definitive cause for her abdominal pain has been identified.  I did offer reassurance that nothing appeared acute or emergently surgical.  My recommendations are for her to return to her gastroenterologist to discuss the next appropriate course of action.  I will prescribe a small quantity of pain medication which she can take in the meantime.     Final diagnoses:  None    ED Discharge Orders     None          Geroldine Berg, MD 08/31/24 0600

## 2024-08-31 NOTE — Discharge Instructions (Signed)
 Begin taking ibuprofen 600 mg every 6 hours as needed for pain.  Begin taking hydrocodone  as prescribed as needed for pain not relieved with ibuprofen.  Follow-up with your gastroenterologist if symptoms persist.

## 2024-08-31 NOTE — ED Triage Notes (Signed)
 Pt c/o abdominal pain for several months, reports she just finished a course of antibiotics last night for Colitis. States her pain has not improved, denies N/V/D. Hx of IBS and  Diverticulosis
